# Patient Record
Sex: Male | Born: 2000 | Race: White | Hispanic: No | Marital: Single | Smoking: Never smoker
Health system: Southern US, Community
[De-identification: ages and names within clinical notes are randomized; demographics above are authoritative.]

## PROBLEM LIST (undated history)

## (undated) DIAGNOSIS — F902 Attention-deficit hyperactivity disorder, combined type: Principal | ICD-10-CM

## (undated) DIAGNOSIS — T7840XA Allergy, unspecified, initial encounter: Secondary | ICD-10-CM

## (undated) DIAGNOSIS — R278 Other lack of coordination: Secondary | ICD-10-CM

## (undated) DIAGNOSIS — H9325 Central auditory processing disorder: Secondary | ICD-10-CM

## (undated) HISTORY — DX: Central auditory processing disorder: H93.25

## (undated) HISTORY — DX: Other lack of coordination: R27.8

## (undated) HISTORY — DX: Attention-deficit hyperactivity disorder, combined type: F90.2

## (undated) HISTORY — DX: Allergy, unspecified, initial encounter: T78.40XA

---
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
## 2022-01-05 ENCOUNTER — Other Ambulatory Visit: Payer: Self-pay | Admitting: Pediatrics

## 2022-01-05 MED ORDER — LISDEXAMFETAMINE DIMESYLATE 70 MG PO CAPS: 70.0000 mg | ORAL_CAPSULE | ORAL | 0 refills | Status: DC

## 2022-01-05 NOTE — Telephone Encounter (Signed)
Requested return to [place] for above e-scribed and sent to pharmacy on record  [id number] - [place], [place]. AT [address] [place] [address] Phone: [phone] Fax: [phone]

## 2022-01-12 ENCOUNTER — Telehealth: Payer: Self-pay | Admitting: Pediatrics

## 2022-01-12 ENCOUNTER — Encounter: Payer: 59 | Admitting: Pediatrics

## 2022-01-12 NOTE — Telephone Encounter (Signed)
Called patient and mom regarding no-show.  Left messages on both phones and still no reply as to why they did not show up for the appointment, or to reschedule.

## 2022-02-02 ENCOUNTER — Other Ambulatory Visit: Payer: Self-pay | Admitting: Pediatrics

## 2022-02-02 MED ORDER — LISDEXAMFETAMINE DIMESYLATE 70 MG PO CAPS: 70.0000 mg | ORAL_CAPSULE | ORAL | 0 refills | Status: DC

## 2022-02-02 NOTE — Addendum Note (Signed)
Addended by: Cindia Hustead A on: 02/02/2022 12:13 PM   Modules accepted: Orders

## 2022-02-02 NOTE — Telephone Encounter (Signed)
RX for above e-scribed and sent to pharmacy on record  [id number] - [place], [place] - [address] [place] [address] Phone: [phone] Fax: [phone]    

## 2022-03-05 ENCOUNTER — Other Ambulatory Visit: Payer: Self-pay

## 2022-03-05 MED ORDER — LISDEXAMFETAMINE DIMESYLATE 70 MG PO CAPS: 70.0000 mg | ORAL_CAPSULE | ORAL | 0 refills | Status: DC

## 2022-03-05 NOTE — Telephone Encounter (Signed)
RX for above e-scribed and sent to pharmacy on record  [id number] - [place], [place] - [address] [place] [address] Phone: [phone] Fax: [phone]    

## 2022-04-02 ENCOUNTER — Other Ambulatory Visit: Payer: Self-pay

## 2022-04-02 MED ORDER — FLUOXETINE HCL 20 MG PO CAPS
20.0000 mg | ORAL_CAPSULE | Freq: Every day | ORAL | 0 refills | Status: DC
Start: 2022-04-02 — End: 2022-05-01

## 2022-04-02 MED ORDER — GUANFACINE HCL ER 3 MG PO TB24: 1.0000 | ORAL_TABLET | Freq: Two times a day (BID) | ORAL | 0 refills | Status: DC

## 2022-04-02 NOTE — Telephone Encounter (Signed)
Not seen since 06/2021 NOS to last appointment with no answers to phone call to reschedule.  Filled Rx for 30 days supply only Needs to make appointment E-Prescribed directly to  [id number] - [place], [place] - [address] AT [address] [facility] [address] [place] [address] Phone: [phone] Fax: [phone]

## 2022-04-11 ENCOUNTER — Other Ambulatory Visit: Payer: Self-pay | Admitting: Pediatrics

## 2022-04-11 MED ORDER — LISDEXAMFETAMINE DIMESYLATE 70 MG PO CAPS: 70.0000 mg | ORAL_CAPSULE | ORAL | 0 refills | Status: DC

## 2022-04-11 NOTE — Telephone Encounter (Signed)
Vyvanse 70 mg daily, # 30 with no RF's.RX for above e-scribed and sent to pharmacy on record  [id number] - [place], [place] - [address] [place] [address] Phone: [phone] Fax: [phone]      

## 2022-04-11 NOTE — Telephone Encounter (Signed)
Refill for vyvanse sent to [facility] pharmacy

## 2022-04-19 ENCOUNTER — Telehealth: Payer: Self-pay

## 2022-04-19 NOTE — Telephone Encounter (Signed)
Outcome  Additional Information Required  Drug is covered by current benefit plan. No further PA activity needed.

## 2022-05-01 ENCOUNTER — Other Ambulatory Visit: Payer: Self-pay

## 2022-05-01 ENCOUNTER — Other Ambulatory Visit: Payer: Self-pay | Admitting: Family

## 2022-05-01 MED ORDER — FLUOXETINE HCL 20 MG PO CAPS: 20.0000 mg | ORAL_CAPSULE | Freq: Every day | ORAL | 2 refills | Status: DC

## 2022-05-01 MED ORDER — GUANFACINE HCL ER 3 MG PO TB24: 1.0000 | ORAL_TABLET | Freq: Two times a day (BID) | ORAL | 2 refills | Status: DC

## 2022-05-01 NOTE — Telephone Encounter (Signed)
Prozac 20 mg daily #30 with 2 RF's and Intuniv 3 mg BID, #60 with 2 RF's.RX for above e-scribed and sent to pharmacy on record  [id number] [place], [place]. AT [address] [place] [address] Phone: [phone] Fax: [phone]

## 2022-05-02 NOTE — Telephone Encounter (Signed)
RX for above e-scribed and sent to pharmacy on record  [id number] - [place], [place] - [address] [place] [address] Phone: [phone] Fax: [phone]    

## 2022-05-08 ENCOUNTER — Other Ambulatory Visit: Payer: Self-pay

## 2022-05-08 MED ORDER — LISDEXAMFETAMINE DIMESYLATE 70 MG PO CAPS: 70.0000 mg | ORAL_CAPSULE | ORAL | 0 refills | Status: DC

## 2022-05-08 NOTE — Telephone Encounter (Signed)
RX for above e-scribed and sent to pharmacy on record  [id number] - [place], [place] - [address] [place]  [address] Phone: [phone] Fax: [phone]    

## 2022-05-28 ENCOUNTER — Other Ambulatory Visit: Payer: Self-pay | Admitting: Pediatrics

## 2022-05-28 NOTE — Telephone Encounter (Signed)
Intuniv 3 mg BID, #180 with 2 RF's.RX for above e-scribed and sent to pharmacy on record  [id number] [place] [address] [place]Phone: [phone]: [phone]

## 2022-06-04 ENCOUNTER — Encounter (HOSPITAL_BASED_OUTPATIENT_CLINIC_OR_DEPARTMENT_OTHER): Payer: Self-pay | Admitting: Family Medicine

## 2022-06-04 ENCOUNTER — Ambulatory Visit (INDEPENDENT_AMBULATORY_CARE_PROVIDER_SITE_OTHER): Payer: 59 | Admitting: Family Medicine

## 2022-06-04 ENCOUNTER — Telehealth (HOSPITAL_BASED_OUTPATIENT_CLINIC_OR_DEPARTMENT_OTHER): Payer: Self-pay | Admitting: Family Medicine

## 2022-06-04 VITALS — BP 121/59 | HR 76 | Ht 72.08 in | Wt 155.8 lb

## 2022-06-04 DIAGNOSIS — F902 Attention-deficit hyperactivity disorder, combined type: Secondary | ICD-10-CM | POA: Diagnosis not present

## 2022-06-04 DIAGNOSIS — L700 Acne vulgaris: Secondary | ICD-10-CM

## 2022-06-04 MED ORDER — LISDEXAMFETAMINE DIMESYLATE 70 MG PO CAPS: 70.0000 mg | ORAL_CAPSULE | ORAL | 0 refills | Status: DC

## 2022-06-04 NOTE — Assessment & Plan Note (Addendum)
Prescribed adapalene 0.1% per at bedtime PCP, reports "I have not been using that "reports when he has been applying the cream on his face he then lies down on something and it comes off.  Discussed changing the habit to keep the cream on his face patient reports he does not like change.  He wonders if it is what he is eating, information provided today on acne.  Discussed dermatology referral for further evaluation/treatment.  Patient declines today.  Encourage patient to follow treatment plan per PCP to see if this will help his symptoms. No change in treatment plan made today. Continue current treatment.

## 2022-06-04 NOTE — Telephone Encounter (Signed)
Pt Advised No to the FLU Vaccine

## 2022-06-04 NOTE — Progress Notes (Signed)
Established Patient Office Visit  Subjective   Patient ID: Joshua Ashley, male    DOB: 2000-07-10  Age: 22 y.o. MRN: [id number]  Chief Complaint  Patient presents with   Follow-up    Pt here for f/u on ADHD medication, pt also stated he would like to see if he could get acne medication prescribed in a pill form     HPI  ADHD: Joshua Ashley, previous prescriber recently retired. Symptoms are controlled well. Has been on this medication for "a couple of months" with good control of symptoms. Wants to stay on this medication. Has not been getting counseling. Works at [employer], symptoms managed well at work. Has been treated for ADHD for 12-13 years.  On Lisdexamfetamine 70 mg QD. PDMP review: # 30 given on 05/08/22.Wishes for primary care to take over prescribing. Meds due 06/12/22.    Acne: prescribed adapalene 0.1 %  cream for bedtime. "I have not been using that"  Reports putting cream on face and then putting his face onto something and it wipes off. Does not like changing habits.  Wonders if it is related to what he eats.  Declines derm referral today.  Bruise on right thumb from trampoline park. ROM intact. Ecchymosis present. No bony tenderness.   ENT referral recently with hearing test. Endorses using ear buds on regular basis. Encouraged not using ear buds.   Review of Systems  HENT:  Negative for ear pain and hearing loss.   Respiratory:  Negative for shortness of breath.   Cardiovascular:  Negative for chest pain.  Neurological:  Negative for dizziness and headaches.      Objective:     BP (!) 121/59 (BP Location: Right Arm, Patient Position: Sitting, Cuff Size: Large)   Pulse 76   Ht 6' 0.08" (1.831 m)   Wt 155 lb 12.8 oz (70.7 kg)   SpO2 99%   BMI 21.08 kg/m  BP Readings from Last 3 Encounters:  06/04/22 (!) 121/59  09/27/21 111/65  09/23/21 (!) 112/55      Physical Exam Vitals and nursing note reviewed.  Constitutional:      General: He is not in  acute distress.    Appearance: Normal appearance. He is normal weight.  HENT:     Right Ear: Tympanic membrane normal.     Left Ear: Tympanic membrane normal.  Pulmonary:     Effort: Pulmonary effort is normal.  Musculoskeletal:     Right hand: No swelling, tenderness or bony tenderness. Normal range of motion. Normal strength. Normal pulse.     Comments: Ecchymosis noted under right thumb. No bony tenderness fingers/wrist. ROM intact to fingers and wrist.   Skin:    General: Skin is warm and dry.     Capillary Refill: Capillary refill takes less than 2 seconds.  Neurological:     General: No focal deficit present.     Mental Status: He is alert. Mental status is at baseline.  Psychiatric:        Mood and Affect: Mood normal.        Behavior: Behavior normal.        Thought Content: Thought content normal.        Judgment: Judgment normal.     No results found for any visits on 06/04/22.    The ASCVD Risk score (Arnett DK, et al., 2019) failed to calculate for the following reasons:   The 2019 ASCVD risk score is only valid for ages 57 to  79    Assessment & Plan:   Problem List Items Addressed This Visit     ADHD (attention deficit hyperactivity disorder), combined type - Primary (Chronic)    Taking Vyvanse 70 mg daily as prescribed.  Previous prescriber has retired, patient would like to transfer medication management to PCP.  Reports symptoms are well-controlled, he has been on this medication for "a couple of months "with good symptom control.  Wants to stay on this medication at the current dose.  Patient works at [employer] and feels his symptoms are well-managed at work as well.  He has been treated for ADHD for 12 to 13 years.  PDMP does not reveal red flags, refill will be sent today this medication is due on 06/12/2022.      Relevant Medications   lisdexamfetamine (VYVANSE) 70 MG capsule (Start on 06/12/2022)   Acne    Prescribed adapalene 0.1% per at bedtime PCP,  reports "I have not been using that "reports when he has been applying the cream on his face he then lies down on something and it comes off.  Discussed changing the habit to keep the cream on his face patient reports he does not like change.  He wonders if it is what he is eating, information provided today on acne.  Discussed dermatology referral for further evaluation/treatment.  Patient declines today.  Encourage patient to follow treatment plan per PCP to see if this will help his symptoms. No change in treatment plan made today. Continue current treatment.      Right thumb ecchymosis. Recommend ice. ROM intact. No bony tenderness. Time will heal the bruise. No indication for x-ray today.   Ear bud use. Recommend decreasing this. Some cerumen present, not impacted. Reports normal hearing test recently. Declines ENT referral today. Okay to use OTC Debrox for cerumen removal.   Agrees with plan of care discussed.  Questions answered.  Return in about 3 months (around 09/02/2022) for ADHD .    Chalmers Guest, FNP

## 2022-06-04 NOTE — Assessment & Plan Note (Signed)
Taking Vyvanse 70 mg daily as prescribed.  Previous prescriber has retired, patient would like to transfer medication management to PCP.  Reports symptoms are well-controlled, he has been on this medication for "a couple of months "with good symptom control.  Wants to stay on this medication at the current dose.  Patient works at [employer] and feels his symptoms are well-managed at work as well.  He has been treated for ADHD for 12 to 13 years.  PDMP does not reveal red flags, refill will be sent today this medication is due on 06/12/2022.

## 2022-06-04 NOTE — Patient Instructions (Signed)
Try new technique for putting cream on face.

## 2022-06-06 ENCOUNTER — Encounter: Payer: 59 | Admitting: Pediatrics

## 2022-06-13 ENCOUNTER — Telehealth (HOSPITAL_BASED_OUTPATIENT_CLINIC_OR_DEPARTMENT_OTHER): Payer: Self-pay | Admitting: Family Medicine

## 2022-06-13 NOTE — Telephone Encounter (Signed)
Pts father called needing medication  lisdexamfetamine (VYVANSE) 56 MG capsule [id number]  Sent to  [facility] [address] [place], [place], [place] [address]  [facility]. Please advise.

## 2022-06-14 ENCOUNTER — Other Ambulatory Visit: Payer: Self-pay | Admitting: Family Medicine

## 2022-06-14 DIAGNOSIS — F902 Attention-deficit hyperactivity disorder, combined type: Secondary | ICD-10-CM

## 2022-06-14 MED ORDER — LISDEXAMFETAMINE DIMESYLATE 70 MG PO CAPS: 70.0000 mg | ORAL_CAPSULE | ORAL | 0 refills | Status: DC

## 2022-06-14 NOTE — Progress Notes (Signed)
Pharmacy initially sent does not have in stock. [facility] [address]

## 2022-06-14 NOTE — Progress Notes (Signed)
Reports the last pharmacy prescription sent was incorrect.  Sent to [facility] on [address] per father request. PDMP reviewed. Last filled 05/08/22.

## 2022-06-14 NOTE — Telephone Encounter (Signed)
Father called it was suppose to be [facility] on [address] [place], [place] [address]. Communication error.

## 2022-06-14 NOTE — Telephone Encounter (Signed)
Pt father just called stated that the pharmacy at [facility] does not take insurance. Would like it canceled [facility]   [facility] [address], [place], [place] [address] Please advise.

## 2022-07-12 ENCOUNTER — Ambulatory Visit (INDEPENDENT_AMBULATORY_CARE_PROVIDER_SITE_OTHER): Payer: 59 | Admitting: Student

## 2022-07-12 ENCOUNTER — Other Ambulatory Visit: Payer: Self-pay | Admitting: Family Medicine

## 2022-07-12 ENCOUNTER — Ambulatory Visit (INDEPENDENT_AMBULATORY_CARE_PROVIDER_SITE_OTHER): Payer: 59

## 2022-07-12 ENCOUNTER — Other Ambulatory Visit (HOSPITAL_BASED_OUTPATIENT_CLINIC_OR_DEPARTMENT_OTHER): Payer: Self-pay | Admitting: Family Medicine

## 2022-07-12 ENCOUNTER — Other Ambulatory Visit (HOSPITAL_BASED_OUTPATIENT_CLINIC_OR_DEPARTMENT_OTHER): Payer: Self-pay

## 2022-07-12 DIAGNOSIS — F902 Attention-deficit hyperactivity disorder, combined type: Secondary | ICD-10-CM

## 2022-07-12 DIAGNOSIS — M79644 Pain in right finger(s): Secondary | ICD-10-CM

## 2022-07-12 DIAGNOSIS — S63641A Sprain of metacarpophalangeal joint of right thumb, initial encounter: Secondary | ICD-10-CM | POA: Diagnosis not present

## 2022-07-12 MED ORDER — LISDEXAMFETAMINE DIMESYLATE 70 MG PO CAPS: 70.0000 mg | ORAL_CAPSULE | ORAL | 0 refills | Status: DC

## 2022-07-12 NOTE — Progress Notes (Signed)
Chief Complaint: Right thumb pain     History of Present Illness:    Joshua Ashley is a 22 y.o. male presents for evaluation of pain in his right thumb.  He states that on Saturday he was at a trampoline park when he hyperextended his thumb back toward his wrist and felt a popping sensation.  He has since then had some aching pain in the base of his thumb and problems specifically when trying to lift heavier objects.  He has no previous history of injury to this thumb.  He is right-hand dominant.  His pain levels are very minimal today however it is more uncomfortable when performing certain activities.  He has been taking ibuprofen as well as icing which has helped. He works at [employer] nearby and worked a shift this morning.   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 Social History   Socioeconomic History   Marital status: Single    Spouse name: Not on file   Number of children: Not on file   Years of education: Not on file   Highest education level: Not on file  Occupational History   Not on file  Tobacco Use   Smoking status: Never    Passive exposure: Never   Smokeless tobacco: Never  Substance and Sexual Activity   Alcohol use: Never   Drug use: Never   Sexual activity: Never  Other Topics Concern   Not on file  Social History Narrative   Not on file   Social Determinants of Health   Financial Resource Strain: Not on file  Food Insecurity: Not on file  Transportation Needs: Not on file  Physical Activity: Not on file  Stress: Not on file  Social Connections: Not on file   No family history on file. Allergies  Allergen Reactions   Carrot Flavor  [Flavoring Agent] Hives   Pear Hives   Current Outpatient Medications  Medication Sig Dispense Refill   adapalene (DIFFERIN) 0.1 % cream Apply topically at bedtime. (Patient not taking: Reported on 06/04/2022) 45 g 1   Benzoyl Peroxide 2.5 % CREA Apply 1 application. topically daily. 21 g 2   FLUoxetine (PROZAC) 20 MG capsule Take 1 capsule (20 mg total) by mouth daily. 30 capsule 2   GuanFACINE HCl 3 MG TB24 TAKE 1 TABLET (3 MG TOTAL) BY MOUTH TWICE A DAY 180 tablet 2   lisdexamfetamine (VYVANSE) 70 MG capsule Take 1 capsule (70 mg total) by mouth every morning. 30 capsule 0   loratadine (CLARITIN) 10 MG tablet Take 10 mg by mouth daily.     omeprazole (PRILOSEC) 20 MG capsule Take by mouth daily.  6   No current facility-administered medications for this visit.   No results found.  Review of Systems:   A ROS was performed including pertinent positives and negatives as documented in the HPI.  Physical Exam :   Constitutional: NAD and appears stated age Neurological: Alert and oriented Psych: Appropriate affect and cooperative There were no vitals taken for this visit.   Comprehensive Musculoskeletal Exam:    Patient  has mild tenderness to palpation over the right first metatarsal and MCP joint.  Mild soft tissue edema around base of the thumb. No tenderness throughout [facility] joint, wrist, or distal forearm.  Normal active and passive range of motion throughout thumb IP and MCP joint with 5/5 strength.  Wrist range of motion to 90 degrees flexion and 20 degrees extension without pain.  Radial pulse 2+.  Distal sensation intact and equal throughout all 5 digits.  Imaging:   Xray (right hand 3 views): Negative for fracture or dislocation  I personally reviewed and interpreted the radiographs.   Assessment:   22 y.o. male presenting for assessment of right thumb pain he sustained in a hyperextension injury on Saturday.  X-rays are negative for any acute fracture or dislocation.  I  suspect this to be a sprain and that he would benefit from some immobilization to allow healing.  I recommended that he use a thumb splint for the next few weeks, especially while performing physical activities or at work. He can begin weaning out as tolerated at that point. Also advised that he can continue with ice and ibuprofen as needed for pain and swelling control. Patient is in agreement and all questions addressed and answered.  Plan :    - Use thumb splint and follow up in clinic as needed     I personally saw and evaluated the patient, and participated in the management and treatment plan.  Marnee Spring, PA-C Orthopedics  This document was dictated using Systems analyst. A reasonable attempt at proof reading has been made to minimize errors.

## 2022-07-16 ENCOUNTER — Other Ambulatory Visit (HOSPITAL_BASED_OUTPATIENT_CLINIC_OR_DEPARTMENT_OTHER): Payer: Self-pay | Admitting: Family Medicine

## 2022-07-16 DIAGNOSIS — F902 Attention-deficit hyperactivity disorder, combined type: Secondary | ICD-10-CM

## 2022-07-16 MED ORDER — LISDEXAMFETAMINE DIMESYLATE 70 MG PO CAPS: 70.0000 mg | ORAL_CAPSULE | ORAL | 0 refills | Status: DC

## 2022-07-16 NOTE — Progress Notes (Signed)
Printed controlled prescription for Dr. Tennis Must Cuba's patient since it was not able to be sent electronically last week on 07/12/22. Reviewed PDMP, no red flags. Signed paper prescription and was faxed by Dorothyann Gibbs to his requested pharmacy.

## 2022-08-08 ENCOUNTER — Telehealth (HOSPITAL_BASED_OUTPATIENT_CLINIC_OR_DEPARTMENT_OTHER): Payer: Self-pay | Admitting: Family Medicine

## 2022-08-08 ENCOUNTER — Encounter (HOSPITAL_BASED_OUTPATIENT_CLINIC_OR_DEPARTMENT_OTHER): Payer: Self-pay | Admitting: Family Medicine

## 2022-08-08 ENCOUNTER — Other Ambulatory Visit (HOSPITAL_BASED_OUTPATIENT_CLINIC_OR_DEPARTMENT_OTHER): Payer: Self-pay

## 2022-08-08 DIAGNOSIS — F902 Attention-deficit hyperactivity disorder, combined type: Secondary | ICD-10-CM

## 2022-08-08 MED ORDER — LISDEXAMFETAMINE DIMESYLATE 70 MG PO CAPS: 70.0000 mg | ORAL_CAPSULE | ORAL | 0 refills | Status: DC

## 2022-08-08 NOTE — Telephone Encounter (Signed)
Refill request sent to provider.

## 2022-08-08 NOTE — Telephone Encounter (Signed)
Can I get a script for MRN [id number]

## 2022-09-03 ENCOUNTER — Ambulatory Visit (HOSPITAL_BASED_OUTPATIENT_CLINIC_OR_DEPARTMENT_OTHER): Payer: 59 | Admitting: Family Medicine

## 2022-09-12 ENCOUNTER — Other Ambulatory Visit (HOSPITAL_BASED_OUTPATIENT_CLINIC_OR_DEPARTMENT_OTHER): Payer: Self-pay

## 2022-09-13 ENCOUNTER — Ambulatory Visit (INDEPENDENT_AMBULATORY_CARE_PROVIDER_SITE_OTHER): Payer: 59 | Admitting: Family Medicine

## 2022-09-13 ENCOUNTER — Other Ambulatory Visit (HOSPITAL_BASED_OUTPATIENT_CLINIC_OR_DEPARTMENT_OTHER): Payer: Self-pay

## 2022-09-13 ENCOUNTER — Encounter (HOSPITAL_BASED_OUTPATIENT_CLINIC_OR_DEPARTMENT_OTHER): Payer: Self-pay | Admitting: Family Medicine

## 2022-09-13 VITALS — BP 108/69 | HR 69 | Ht 72.11 in | Wt 155.0 lb

## 2022-09-13 DIAGNOSIS — F902 Attention-deficit hyperactivity disorder, combined type: Secondary | ICD-10-CM

## 2022-09-13 DIAGNOSIS — F419 Anxiety disorder, unspecified: Secondary | ICD-10-CM | POA: Insufficient documentation

## 2022-09-13 DIAGNOSIS — L259 Unspecified contact dermatitis, unspecified cause: Secondary | ICD-10-CM

## 2022-09-13 DIAGNOSIS — M79674 Pain in right toe(s): Secondary | ICD-10-CM

## 2022-09-13 DIAGNOSIS — F17290 Nicotine dependence, other tobacco product, uncomplicated: Secondary | ICD-10-CM | POA: Insufficient documentation

## 2022-09-13 MED ORDER — FLUOXETINE HCL 20 MG PO CAPS
20.0000 mg | ORAL_CAPSULE | Freq: Every day | ORAL | 1 refills | Status: DC
  Filled 2022-09-13: qty 30, 30d supply, fill #0
  Filled 2022-10-09: qty 30, 30d supply, fill #1
  Filled 2022-11-24: qty 30, 30d supply, fill #2
  Filled 2022-12-16: qty 30, 30d supply, fill #3
  Filled 2023-01-28: qty 30, 30d supply, fill #4
  Filled 2023-02-17 – 2023-03-15 (×2): qty 30, 30d supply, fill #5

## 2022-09-13 MED ORDER — TRIAMCINOLONE ACETONIDE 0.1 % EX CREA
1.0000 | TOPICAL_CREAM | Freq: Two times a day (BID) | CUTANEOUS | 0 refills | Status: DC
  Filled 2022-09-13: qty 30, 15d supply, fill #0

## 2022-09-13 MED ORDER — LISDEXAMFETAMINE DIMESYLATE 70 MG PO CAPS
70.0000 mg | ORAL_CAPSULE | ORAL | 0 refills | Status: DC
  Filled 2022-09-13: qty 30, 30d supply, fill #0

## 2022-09-13 NOTE — Assessment & Plan Note (Signed)
Patient doing well at present with use of Vyvanse and guanfacine.  He continues with other medications.  He is requesting refill of Vyvanse today.  Denies any issues with sleep concerns, palpitations, appetite concerns.  PDMP reviewed, no red flags.  Refill of Vyvanse provided today.

## 2022-09-13 NOTE — Assessment & Plan Note (Signed)
Well-controlled with current regimen, requesting refill of fluoxetine today.  Denies any issues with medication. Refill of fluoxetine provided today

## 2022-09-13 NOTE — Progress Notes (Signed)
    Procedures performed today:    None.  Independent interpretation of notes and tests performed by another provider:   None.  Brief History, Exam, Impression, and Recommendations:    BP 108/69 (BP Location: Right Arm, Patient Position: Sitting, Cuff Size: Normal)   Pulse 69   Ht 6' 0.11" (1.832 m)   Wt 155 lb (70.3 kg)   SpO2 100%   BMI 20.96 kg/m   Contact dermatitis Over right chest, patient notes intermittent redness/bumps/skin changes.  He feels that this is related to his name tag for work.  He will wear magnet on the underside of short to hold main tag in place.  It is in this area that he has reported rash.  Has occasionally had itching, intermittent flareups in this area. On exam, patient does have sandpapery feeling skin, intermittent red bumps, no obvious excoriations, no discharge or drainage. Suspect contact dermatitis, discussed options with patient.  Will allow for utilization of steroid cream to help with controlling any itch.  Advised on using barrier between skin and magnet for name tag.  Discussed options such as undershirt, tape or some other barrier applied to back of magnet  ADHD (attention deficit hyperactivity disorder), combined type Patient doing well at present with use of Vyvanse and guanfacine.  He continues with other medications.  He is requesting refill of Vyvanse today.  Denies any issues with sleep concerns, palpitations, appetite concerns.  PDMP reviewed, no red flags.  Refill of Vyvanse provided today.  Great toe pain, right Patient reports pain at MCP joint of great toe which has been present for about 1 year.  Does not recall specific trauma, thinks he may have jammed the toe jumping on trampoline at [facility] in the past and that this may be related.  Pain mostly noticed when plantar flexing great toe.  Has not had any swelling.  No prior evaluation. On exam, no significant tenderness to palpation at MCP joint, normal active and passive range of  motion, mild discomfort with plantarflexion at MCP joint, no significant pain with dorsiflexion. Uncertain etiology, possible capsulitis.  Discussed considerations for evaluation, treatment, management.  At this time, we will proceed with referral to podiatry, could consider use of inserts, possible inserts to limit degree of flexion at MCP joint  Anxiety Well-controlled with current regimen, requesting refill of fluoxetine today.  Denies any issues with medication. Refill of fluoxetine provided today  Vaping nicotine dependence, tobacco product Reports concerns with ongoing vaping use, also indicates that his mother is concerned and would like patient to consider methods to assist with cessation.  Patient does not feel that he is necessarily addicted to nicotine but does find that he has difficulty with the habit he has formed with utilizing vape. We discussed options today which primarily centered around decreasing nicotine dependence.  We discussed gum, lozenges, patches.  We also discussed potential utilization of Chantix.  Patient plans to consider these options and plan to further discuss these at future visit  Return in about 3 months (around 12/14/2022).   ___________________________________________ Lucill Mauck de [place], MD, ABFM, CAQSM Primary Care and Sports Medicine [facility]

## 2022-09-13 NOTE — Assessment & Plan Note (Signed)
Reports concerns with ongoing vaping use, also indicates that his mother is concerned and would like patient to consider methods to assist with cessation.  Patient does not feel that he is necessarily addicted to nicotine but does find that he has difficulty with the habit he has formed with utilizing vape. We discussed options today which primarily centered around decreasing nicotine dependence.  We discussed gum, lozenges, patches.  We also discussed potential utilization of Chantix.  Patient plans to consider these options and plan to further discuss these at future visit

## 2022-09-13 NOTE — Assessment & Plan Note (Signed)
Over right chest, patient notes intermittent redness/bumps/skin changes.  He feels that this is related to his name tag for work.  He will wear magnet on the underside of short to hold main tag in place.  It is in this area that he has reported rash.  Has occasionally had itching, intermittent flareups in this area. On exam, patient does have sandpapery feeling skin, intermittent red bumps, no obvious excoriations, no discharge or drainage. Suspect contact dermatitis, discussed options with patient.  Will allow for utilization of steroid cream to help with controlling any itch.  Advised on using barrier between skin and magnet for name tag.  Discussed options such as undershirt, tape or some other barrier applied to back of magnet

## 2022-09-13 NOTE — Assessment & Plan Note (Signed)
Patient reports pain at MCP joint of great toe which has been present for about 1 year.  Does not recall specific trauma, thinks Joshua Ashley may have jammed the toe jumping on trampoline at [facility] in the past and that this may be related.  Pain mostly noticed when plantar flexing great toe.  Has not had any swelling.  No prior evaluation. On exam, no significant tenderness to palpation at MCP joint, normal active and passive range of motion, mild discomfort with plantarflexion at MCP joint, no significant pain with dorsiflexion. Uncertain etiology, possible capsulitis.  Discussed considerations for evaluation, treatment, management.  At this time, we will proceed with referral to podiatry, could consider use of inserts, possible inserts to limit degree of flexion at MCP joint

## 2022-09-14 ENCOUNTER — Encounter (HOSPITAL_BASED_OUTPATIENT_CLINIC_OR_DEPARTMENT_OTHER): Payer: Self-pay | Admitting: Family Medicine

## 2022-09-15 ENCOUNTER — Other Ambulatory Visit (HOSPITAL_BASED_OUTPATIENT_CLINIC_OR_DEPARTMENT_OTHER): Payer: Self-pay

## 2022-09-20 ENCOUNTER — Telehealth: Payer: Self-pay

## 2022-09-20 ENCOUNTER — Other Ambulatory Visit (HOSPITAL_BASED_OUTPATIENT_CLINIC_OR_DEPARTMENT_OTHER): Payer: Self-pay

## 2022-09-20 MED ORDER — VARENICLINE TARTRATE (STARTER) 0.5 MG X 11 & 1 MG X 42 PO TBPK
ORAL_TABLET | ORAL | 0 refills | Status: AC
Start: 2022-09-20 — End: 2022-10-24
  Filled 2022-09-20: qty 53, 31d supply, fill #0

## 2022-09-20 NOTE — Telephone Encounter (Signed)
LVM for patient to call back [phone], or to call PCP office to schedule follow up apt. AS, CMA  

## 2022-09-20 NOTE — Addendum Note (Signed)
Addended by: DE Peru, Marcy Salvo J on: 09/20/2022 02:18 PM   Modules accepted: Orders

## 2022-10-09 ENCOUNTER — Other Ambulatory Visit: Payer: Self-pay

## 2022-10-09 ENCOUNTER — Other Ambulatory Visit (HOSPITAL_BASED_OUTPATIENT_CLINIC_OR_DEPARTMENT_OTHER): Payer: Self-pay | Admitting: Family Medicine

## 2022-10-09 DIAGNOSIS — F902 Attention-deficit hyperactivity disorder, combined type: Secondary | ICD-10-CM

## 2022-10-10 ENCOUNTER — Other Ambulatory Visit (HOSPITAL_BASED_OUTPATIENT_CLINIC_OR_DEPARTMENT_OTHER): Payer: Self-pay | Admitting: Family Medicine

## 2022-10-10 ENCOUNTER — Other Ambulatory Visit (HOSPITAL_BASED_OUTPATIENT_CLINIC_OR_DEPARTMENT_OTHER): Payer: Self-pay

## 2022-10-10 DIAGNOSIS — F902 Attention-deficit hyperactivity disorder, combined type: Secondary | ICD-10-CM

## 2022-10-10 MED ORDER — LISDEXAMFETAMINE DIMESYLATE 70 MG PO CAPS
70.0000 mg | ORAL_CAPSULE | ORAL | 0 refills | Status: DC
Start: 2022-10-12 — End: 2022-11-14
  Filled 2022-10-12: qty 30, 30d supply, fill #0

## 2022-10-10 NOTE — Progress Notes (Signed)
Printed prescription for refill of Vyvanse requested in office today.  Advised on providing paper prescription and protocols related to this.  PDMP reviewed, no red flags.  Provided paper prescriptions today.  Advised on earliest fill date pertaining to this.

## 2022-10-12 ENCOUNTER — Other Ambulatory Visit (HOSPITAL_BASED_OUTPATIENT_CLINIC_OR_DEPARTMENT_OTHER): Payer: Self-pay

## 2022-11-14 ENCOUNTER — Other Ambulatory Visit (HOSPITAL_BASED_OUTPATIENT_CLINIC_OR_DEPARTMENT_OTHER): Payer: Self-pay

## 2022-11-14 ENCOUNTER — Other Ambulatory Visit (HOSPITAL_BASED_OUTPATIENT_CLINIC_OR_DEPARTMENT_OTHER): Payer: Self-pay | Admitting: Family Medicine

## 2022-11-14 DIAGNOSIS — F902 Attention-deficit hyperactivity disorder, combined type: Secondary | ICD-10-CM

## 2022-11-17 ENCOUNTER — Other Ambulatory Visit (HOSPITAL_BASED_OUTPATIENT_CLINIC_OR_DEPARTMENT_OTHER): Payer: Self-pay

## 2022-11-19 ENCOUNTER — Telehealth (HOSPITAL_BASED_OUTPATIENT_CLINIC_OR_DEPARTMENT_OTHER): Payer: Self-pay | Admitting: Family Medicine

## 2022-11-19 ENCOUNTER — Other Ambulatory Visit (HOSPITAL_BASED_OUTPATIENT_CLINIC_OR_DEPARTMENT_OTHER): Payer: Self-pay

## 2022-11-19 MED ORDER — LISDEXAMFETAMINE DIMESYLATE 70 MG PO CAPS
70.0000 mg | ORAL_CAPSULE | ORAL | 0 refills | Status: DC
Start: 2022-11-19 — End: 2022-12-16
  Filled 2022-11-19: qty 30, 30d supply, fill #0

## 2022-11-19 NOTE — Telephone Encounter (Signed)
Patients father called in very upset on medication VIVANSE 70mg , having trouble getting refills. Pharmacy has been notified and sent to our office to get refilled. Patient has been asked to take a break at work until he gets his meds.  Please call dad and let him know prescription has been sent in or why he has trouble every month getting his prescriptions. [phone] Kathreen Cosier

## 2022-11-19 NOTE — Telephone Encounter (Signed)
Patient dad was called back in regards to this matter. I had to explain to dad controlled substance medications cannot be filled early or picked up. Just wanted to let him know for future preference.

## 2022-11-26 ENCOUNTER — Other Ambulatory Visit (HOSPITAL_BASED_OUTPATIENT_CLINIC_OR_DEPARTMENT_OTHER): Payer: Self-pay

## 2022-11-26 MED FILL — Guanfacine HCl Tab ER 24HR 3 MG (Base Equiv): ORAL | 30 days supply | Qty: 60 | Fill #0 | Status: AC

## 2022-11-27 ENCOUNTER — Other Ambulatory Visit (HOSPITAL_BASED_OUTPATIENT_CLINIC_OR_DEPARTMENT_OTHER): Payer: Self-pay

## 2022-11-27 ENCOUNTER — Other Ambulatory Visit (HOSPITAL_COMMUNITY): Payer: Self-pay

## 2022-11-27 ENCOUNTER — Telehealth (HOSPITAL_BASED_OUTPATIENT_CLINIC_OR_DEPARTMENT_OTHER): Payer: Self-pay | Admitting: Family Medicine

## 2022-11-27 NOTE — Telephone Encounter (Signed)
Pt stopped by as the pharmacy needed a RX for --GuanFACINE HCl 3 MG TB24 once a day   Per the script he is suppose to take them 2 times daily which is causing the need for Authorization  So it is $0 for 60 tablets (30 day supply based on current instructions). If he is only supposed to be taking it once a daily though, we should get a new rx stating that

## 2022-11-29 ENCOUNTER — Other Ambulatory Visit (HOSPITAL_BASED_OUTPATIENT_CLINIC_OR_DEPARTMENT_OTHER): Payer: Self-pay

## 2022-11-29 MED ORDER — GUANFACINE HCL ER 3 MG PO TB24
3.0000 mg | ORAL_TABLET | Freq: Every day | ORAL | 1 refills | Status: DC
  Filled 2022-11-29 – 2022-12-18 (×2): qty 90, 90d supply, fill #0
  Filled 2022-12-19: qty 30, 30d supply, fill #0
  Filled 2023-02-17: qty 30, 30d supply, fill #1
  Filled 2023-03-24: qty 30, 30d supply, fill #2
  Filled 2023-05-01: qty 30, 30d supply, fill #3
  Filled 2023-05-26: qty 30, 30d supply, fill #4
  Filled 2023-06-25: qty 30, 30d supply, fill #5

## 2022-11-29 NOTE — Addendum Note (Signed)
Addended by: DE Peru, Marcy Salvo J on: 11/29/2022 01:01 PM   Modules accepted: Orders

## 2022-12-10 ENCOUNTER — Ambulatory Visit (HOSPITAL_BASED_OUTPATIENT_CLINIC_OR_DEPARTMENT_OTHER): Payer: 59 | Admitting: Family Medicine

## 2022-12-16 ENCOUNTER — Other Ambulatory Visit (HOSPITAL_BASED_OUTPATIENT_CLINIC_OR_DEPARTMENT_OTHER): Payer: Self-pay | Admitting: Family Medicine

## 2022-12-16 DIAGNOSIS — F902 Attention-deficit hyperactivity disorder, combined type: Secondary | ICD-10-CM

## 2022-12-17 ENCOUNTER — Other Ambulatory Visit (HOSPITAL_BASED_OUTPATIENT_CLINIC_OR_DEPARTMENT_OTHER): Payer: Self-pay

## 2022-12-18 ENCOUNTER — Other Ambulatory Visit: Payer: Self-pay

## 2022-12-18 NOTE — Telephone Encounter (Signed)
Patients father also calling in today wondering why son's medication has such a hard time filling his prescriptions for Vyvanse 70mg . The need to figure out a way that he can get his medication each month without a hassle and can we come up with a protocol to help them.

## 2022-12-19 ENCOUNTER — Other Ambulatory Visit: Payer: Self-pay

## 2022-12-19 ENCOUNTER — Other Ambulatory Visit (HOSPITAL_BASED_OUTPATIENT_CLINIC_OR_DEPARTMENT_OTHER): Payer: Self-pay

## 2022-12-19 ENCOUNTER — Telehealth (HOSPITAL_BASED_OUTPATIENT_CLINIC_OR_DEPARTMENT_OTHER): Payer: Self-pay

## 2022-12-19 MED ORDER — LISDEXAMFETAMINE DIMESYLATE 70 MG PO CAPS
70.0000 mg | ORAL_CAPSULE | ORAL | 0 refills | Status: DC
Start: 2022-12-19 — End: 2023-01-01
  Filled 2022-12-19: qty 13, 13d supply, fill #0

## 2022-12-19 NOTE — Telephone Encounter (Signed)
Call from patients dad, frustrated again due to delays in getting sons Vyvanse. Spoke with patients dad, explaining the controlled substance agreement & UDS policy that we have. I do not see that patient has a CSA in his chart. Father states he was told patient should follow up every 6 months for refills on this medication from his prior PCP that has now since closed, I clarified that it is every 3 months for controlled medications. He expressed his frustration, how there is a delay every month getting this medication, he states we need a better way on responding to refills. I recommended patient possibly send refill request PRIOR to running out of medication to prevent this monthly issue. Father wishes to speak directly to Dr.DeCuba regarding this issue, he plans on coming to a future appointment to discuss this with Dr.DeCuba but father is unable to make appointment with son on 09/17. I will see if a provider in office will allow for a short fill to last until upcoming OV, father states patient is at work, unable to work due to being without meds for 3 days.

## 2022-12-19 NOTE — Telephone Encounter (Signed)
Father calling back to follow up on prescription

## 2022-12-22 IMAGING — DX DG ANKLE COMPLETE 3+V*L*
3 series · 3 of 3 positions shown · non-contrast
Comparison: None Available.

CLINICAL DATA: Left ankle injury with pain and swelling.

EXAM:
LEFT ANKLE COMPLETE - 3+ VIEW

[ankle ap]
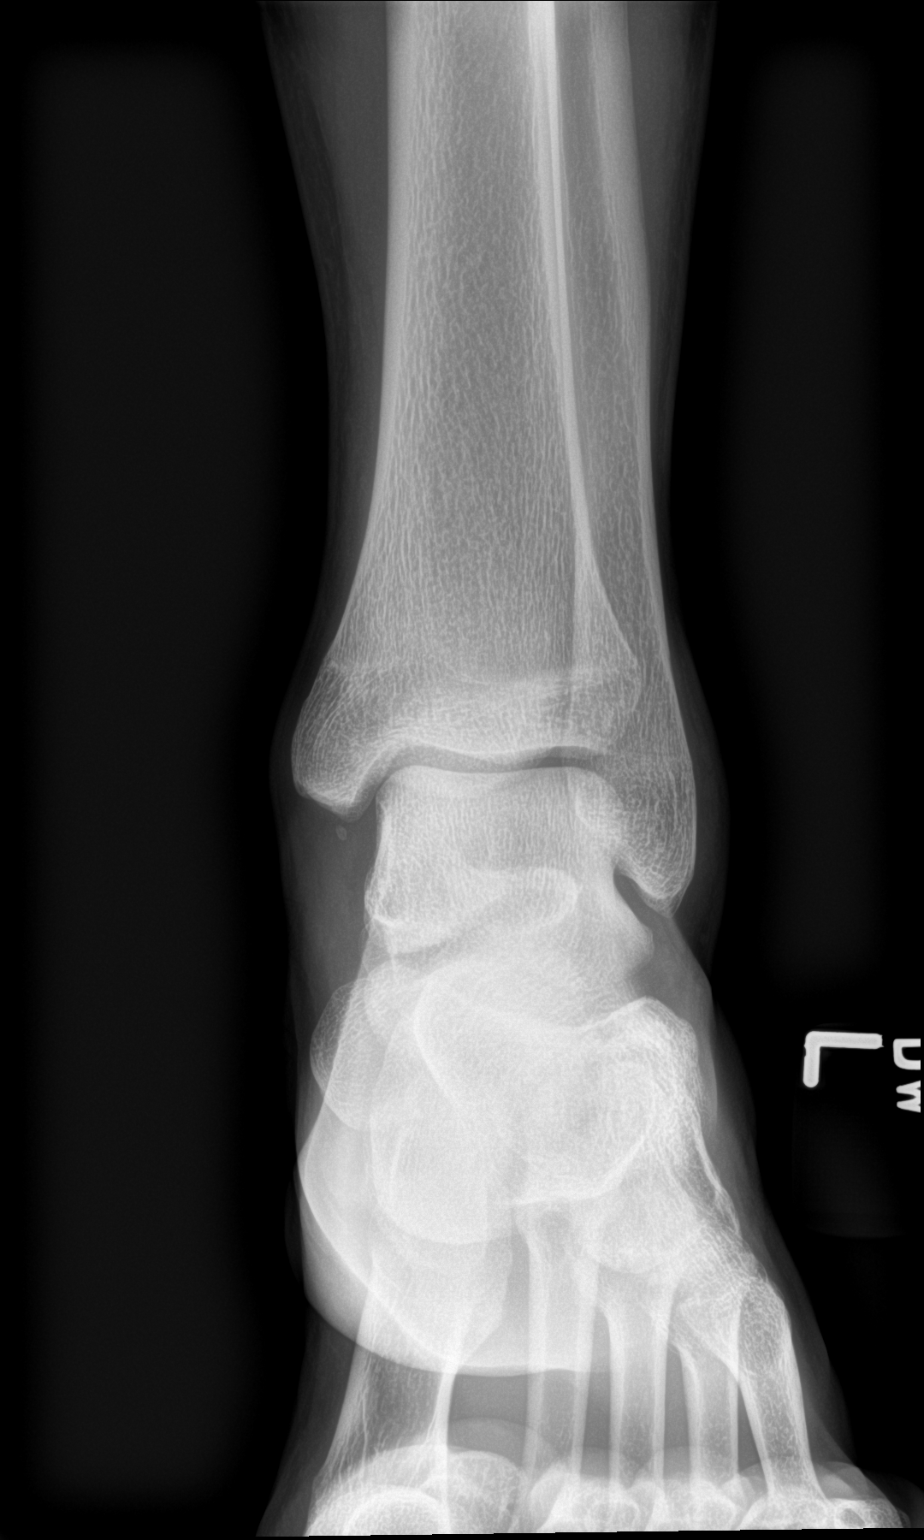

[ankle obl]
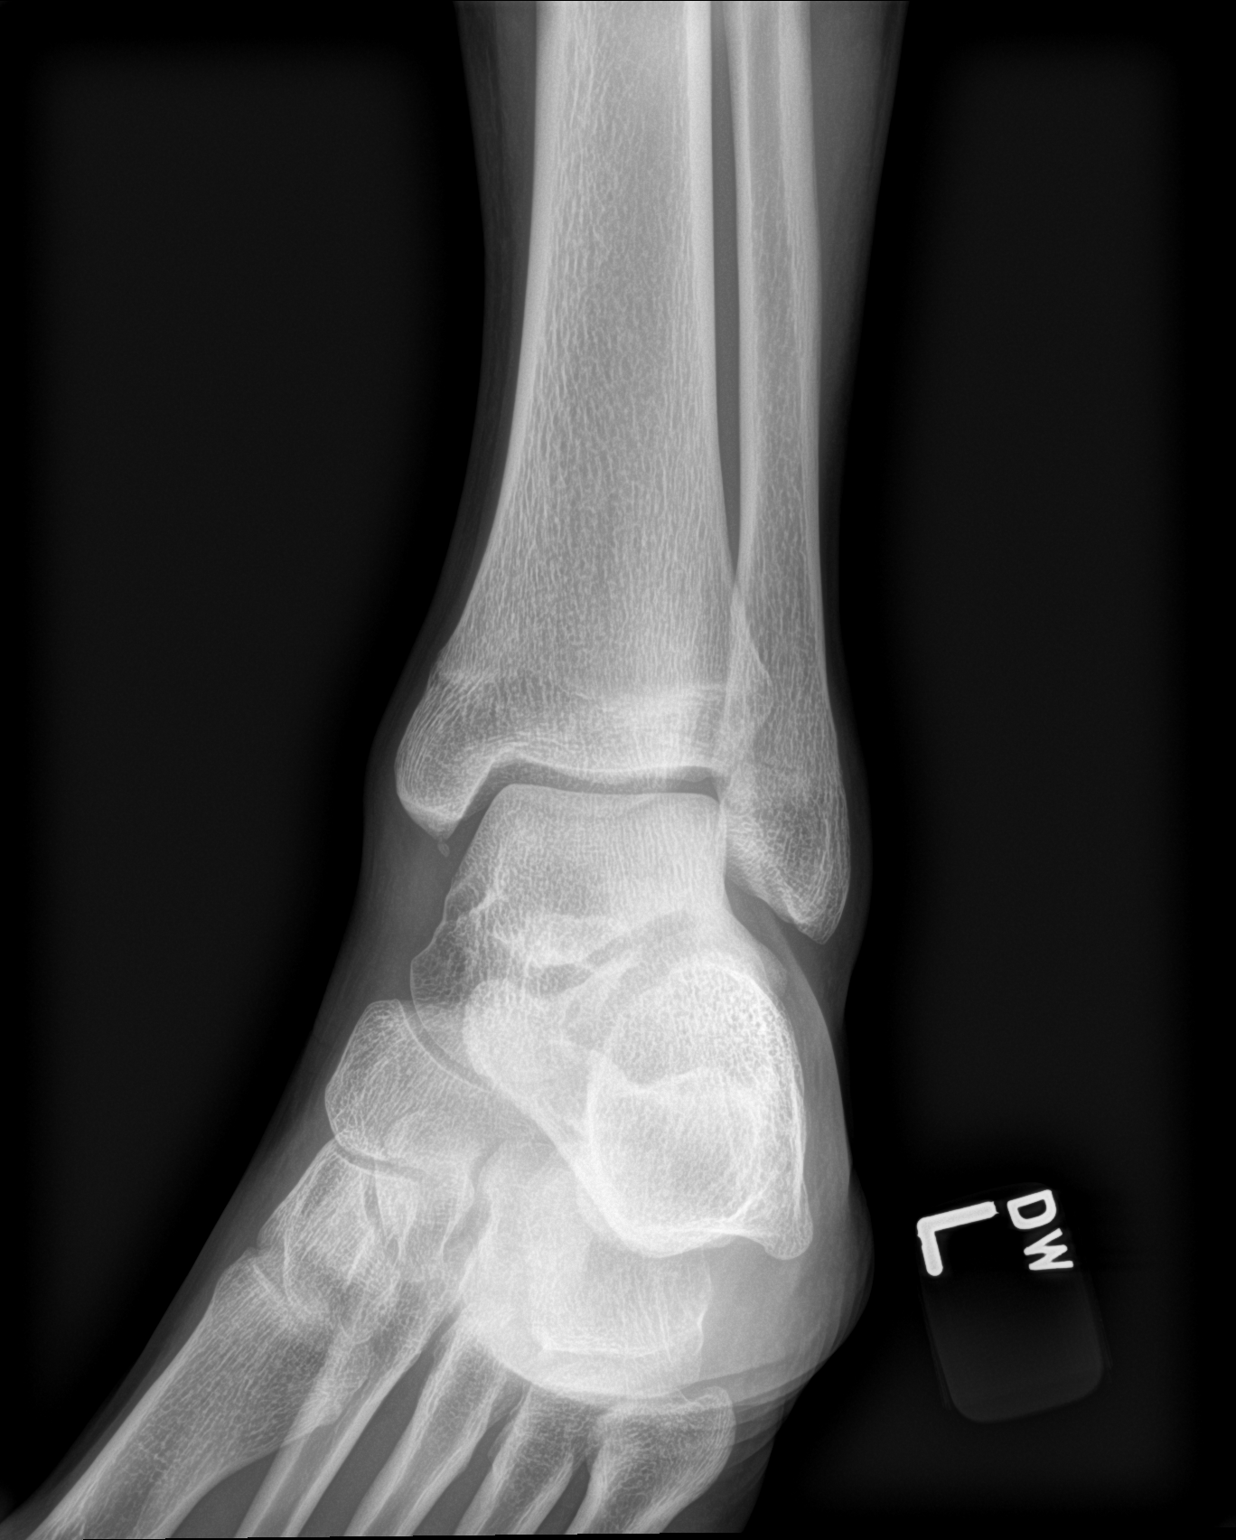

[ankle lat]
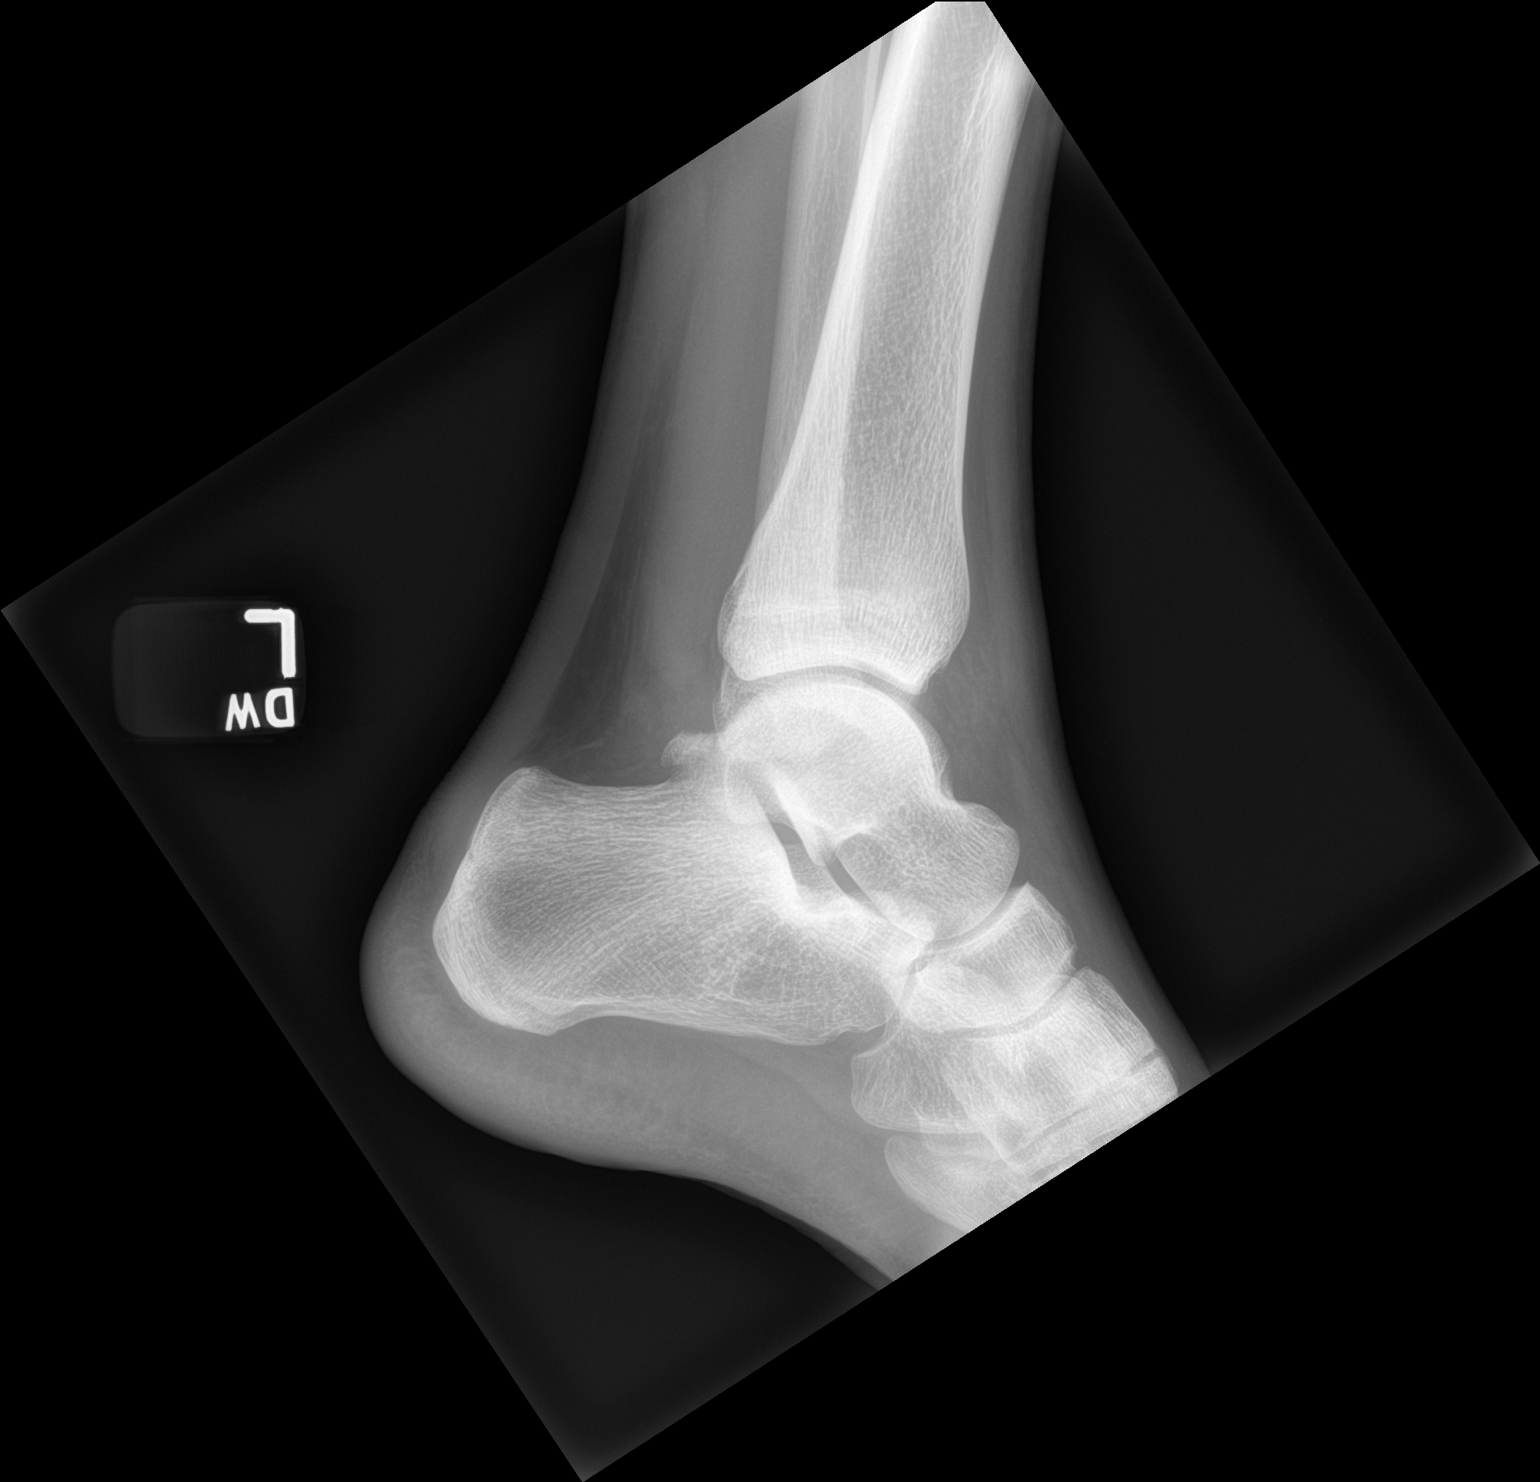

[3 of 3 positions shown; findings below may reference images not displayed]

FINDINGS: There is no evidence of fracture, dislocation, or joint effusion. A
corticated bony density is seen inferior to the medial malleolus,
likely chronic. There is no evidence of arthropathy or other focal
bone abnormality. Mild soft tissue swelling is present about the
ankle.
IMPRESSION: 1. No acute fracture or dislocation.
2. Corticated bony density inferior to the medial malleolus, likely
related to old trauma.

## 2022-12-27 ENCOUNTER — Other Ambulatory Visit (HOSPITAL_BASED_OUTPATIENT_CLINIC_OR_DEPARTMENT_OTHER): Payer: Self-pay

## 2023-01-01 ENCOUNTER — Ambulatory Visit (INDEPENDENT_AMBULATORY_CARE_PROVIDER_SITE_OTHER): Payer: 59 | Admitting: Family Medicine

## 2023-01-01 ENCOUNTER — Other Ambulatory Visit (HOSPITAL_BASED_OUTPATIENT_CLINIC_OR_DEPARTMENT_OTHER): Payer: Self-pay

## 2023-01-01 ENCOUNTER — Other Ambulatory Visit (HOSPITAL_BASED_OUTPATIENT_CLINIC_OR_DEPARTMENT_OTHER): Payer: Self-pay | Admitting: Family Medicine

## 2023-01-01 ENCOUNTER — Encounter (HOSPITAL_BASED_OUTPATIENT_CLINIC_OR_DEPARTMENT_OTHER): Payer: Self-pay

## 2023-01-01 ENCOUNTER — Encounter (HOSPITAL_BASED_OUTPATIENT_CLINIC_OR_DEPARTMENT_OTHER): Payer: Self-pay | Admitting: Family Medicine

## 2023-01-01 DIAGNOSIS — F902 Attention-deficit hyperactivity disorder, combined type: Secondary | ICD-10-CM

## 2023-01-01 MED ORDER — LISDEXAMFETAMINE DIMESYLATE 70 MG PO CAPS
70.0000 mg | ORAL_CAPSULE | ORAL | 0 refills | Status: DC
Start: 2023-01-01 — End: 2023-01-30
  Filled 2023-01-01: qty 30, 30d supply, fill #0

## 2023-01-01 NOTE — Telephone Encounter (Signed)
Routing this over to Dr. De Peru for him to handle if patient keeps his appt today 9/17.

## 2023-01-01 NOTE — Progress Notes (Signed)
    Procedures performed today:    None.  Independent interpretation of notes and tests performed by another provider:   None.  Brief History, Exam, Impression, and Recommendations:    BP 128/84 (BP Location: Right Arm, Patient Position: Sitting, Cuff Size: Normal)   Pulse (!) 58   Ht 6\' 1"  (1.854 m)   Wt 157 lb 11.2 oz (71.5 kg)   SpO2 98%   BMI 20.81 kg/m   ADHD (attention deficit hyperactivity disorder), combined type Assessment & Plan: Patient doing well at present with use of Vyvanse and guanfacine.  He is requesting refill of Vyvanse today.  Denies any issues with sleep concerns, palpitations, appetite concerns.  PDMP reviewed, no red flags.  Refill of Vyvanse provided today.  Orders: -     Lisdexamfetamine Dimesylate; Take 1 capsule (70 mg total) by mouth every morning.  Dispense: 30 capsule; Refill: 0  Return in about 3 months (around 04/02/2023) for med check, can be virtual.   ___________________________________________ Laparis Durrett de [place], MD, ABFM, CAQSM Primary Care and Sports Medicine [facility]

## 2023-01-01 NOTE — Assessment & Plan Note (Signed)
Patient doing well at present with use of Vyvanse and guanfacine.  He is requesting refill of Vyvanse today.  Denies any issues with sleep concerns, palpitations, appetite concerns.  PDMP reviewed, no red flags.  Refill of Vyvanse provided today.

## 2023-01-29 ENCOUNTER — Other Ambulatory Visit (HOSPITAL_BASED_OUTPATIENT_CLINIC_OR_DEPARTMENT_OTHER): Payer: Self-pay

## 2023-01-29 ENCOUNTER — Other Ambulatory Visit (HOSPITAL_BASED_OUTPATIENT_CLINIC_OR_DEPARTMENT_OTHER): Payer: Self-pay | Admitting: Family Medicine

## 2023-01-29 ENCOUNTER — Encounter (HOSPITAL_BASED_OUTPATIENT_CLINIC_OR_DEPARTMENT_OTHER): Payer: Self-pay | Admitting: Family Medicine

## 2023-01-29 DIAGNOSIS — F902 Attention-deficit hyperactivity disorder, combined type: Secondary | ICD-10-CM

## 2023-01-30 ENCOUNTER — Other Ambulatory Visit (HOSPITAL_BASED_OUTPATIENT_CLINIC_OR_DEPARTMENT_OTHER): Payer: Self-pay

## 2023-01-30 ENCOUNTER — Other Ambulatory Visit (HOSPITAL_BASED_OUTPATIENT_CLINIC_OR_DEPARTMENT_OTHER): Payer: Self-pay | Admitting: *Deleted

## 2023-01-30 ENCOUNTER — Telehealth (HOSPITAL_BASED_OUTPATIENT_CLINIC_OR_DEPARTMENT_OTHER): Payer: Self-pay | Admitting: Family Medicine

## 2023-01-30 DIAGNOSIS — F902 Attention-deficit hyperactivity disorder, combined type: Secondary | ICD-10-CM

## 2023-01-30 MED ORDER — LISDEXAMFETAMINE DIMESYLATE 70 MG PO CAPS
70.0000 mg | ORAL_CAPSULE | ORAL | 0 refills | Status: DC
Start: 2023-01-30 — End: 2023-02-17
  Filled 2023-01-30: qty 30, 30d supply, fill #0

## 2023-01-30 NOTE — Telephone Encounter (Signed)
Dad called to complain, that we never have the medication sent down when requested, what else do we need to do to get this corrected.  I explained to the Dad, that Macon was doing everything that he should do, the medication will not be released until the 30 days.

## 2023-02-17 ENCOUNTER — Other Ambulatory Visit (HOSPITAL_BASED_OUTPATIENT_CLINIC_OR_DEPARTMENT_OTHER): Payer: Self-pay | Admitting: Family Medicine

## 2023-02-17 DIAGNOSIS — F902 Attention-deficit hyperactivity disorder, combined type: Secondary | ICD-10-CM

## 2023-02-18 ENCOUNTER — Other Ambulatory Visit (HOSPITAL_BASED_OUTPATIENT_CLINIC_OR_DEPARTMENT_OTHER): Payer: Self-pay

## 2023-02-18 ENCOUNTER — Other Ambulatory Visit: Payer: Self-pay

## 2023-02-18 MED ORDER — LISDEXAMFETAMINE DIMESYLATE 70 MG PO CAPS
70.0000 mg | ORAL_CAPSULE | ORAL | 0 refills | Status: DC
  Filled 2023-02-22 – 2023-02-27 (×2): qty 30, 30d supply, fill #0

## 2023-02-22 ENCOUNTER — Other Ambulatory Visit (HOSPITAL_BASED_OUTPATIENT_CLINIC_OR_DEPARTMENT_OTHER): Payer: Self-pay

## 2023-02-27 ENCOUNTER — Other Ambulatory Visit (HOSPITAL_BASED_OUTPATIENT_CLINIC_OR_DEPARTMENT_OTHER): Payer: Self-pay

## 2023-03-07 ENCOUNTER — Encounter (HOSPITAL_BASED_OUTPATIENT_CLINIC_OR_DEPARTMENT_OTHER): Payer: Self-pay | Admitting: Family Medicine

## 2023-03-24 ENCOUNTER — Other Ambulatory Visit (HOSPITAL_BASED_OUTPATIENT_CLINIC_OR_DEPARTMENT_OTHER): Payer: Self-pay | Admitting: Family Medicine

## 2023-03-24 DIAGNOSIS — F902 Attention-deficit hyperactivity disorder, combined type: Secondary | ICD-10-CM

## 2023-03-25 ENCOUNTER — Other Ambulatory Visit: Payer: Self-pay

## 2023-03-25 ENCOUNTER — Other Ambulatory Visit (HOSPITAL_BASED_OUTPATIENT_CLINIC_OR_DEPARTMENT_OTHER): Payer: Self-pay

## 2023-03-25 MED ORDER — LISDEXAMFETAMINE DIMESYLATE 70 MG PO CAPS
70.0000 mg | ORAL_CAPSULE | ORAL | 0 refills | Status: DC
  Filled 2023-03-25 – 2023-03-27 (×2): qty 30, 30d supply, fill #0
  Filled ????-??-??: fill #0

## 2023-03-27 ENCOUNTER — Other Ambulatory Visit (HOSPITAL_BASED_OUTPATIENT_CLINIC_OR_DEPARTMENT_OTHER): Payer: Self-pay

## 2023-04-03 ENCOUNTER — Ambulatory Visit (HOSPITAL_BASED_OUTPATIENT_CLINIC_OR_DEPARTMENT_OTHER): Payer: 59 | Admitting: Family Medicine

## 2023-04-03 ENCOUNTER — Telehealth: Payer: Self-pay

## 2023-04-03 NOTE — Telephone Encounter (Signed)
This message was sent to [facility] in [place].

## 2023-04-03 NOTE — Telephone Encounter (Signed)
Copied from CRM [id number]. Topic: Appointments - Appointment Info/Confirmation >> Apr 03, 2023 11:47 AM Antony Haste wrote: Patient/patient representative is calling for information regarding an appointment. Pt father states that PT will be unable to attend scheduled appointment on 12/19 at 1:10p with his PCP for 7-month follow-up, Pt father states he would like to reschedule, PT only is available after 2:00pm on weekdays, and PT will be unavailable from 12/24-01/03. Pt father would like to discuss further with PT's PCP about rescheduling.  CB#:[phone]

## 2023-04-04 ENCOUNTER — Ambulatory Visit (HOSPITAL_BASED_OUTPATIENT_CLINIC_OR_DEPARTMENT_OTHER): Payer: 59 | Admitting: Family Medicine

## 2023-04-18 ENCOUNTER — Other Ambulatory Visit (HOSPITAL_BASED_OUTPATIENT_CLINIC_OR_DEPARTMENT_OTHER): Payer: Self-pay | Admitting: Family Medicine

## 2023-04-19 ENCOUNTER — Other Ambulatory Visit (HOSPITAL_BASED_OUTPATIENT_CLINIC_OR_DEPARTMENT_OTHER): Payer: Self-pay

## 2023-04-19 MED ORDER — FLUOXETINE HCL 20 MG PO CAPS
20.0000 mg | ORAL_CAPSULE | Freq: Every day | ORAL | 1 refills | Status: DC
  Filled 2023-04-19: qty 30, 30d supply, fill #0
  Filled 2023-05-26: qty 30, 30d supply, fill #1
  Filled 2023-06-25: qty 30, 30d supply, fill #2
  Filled 2023-07-28: qty 30, 30d supply, fill #3
  Filled 2023-08-06 – 2023-08-30 (×2): qty 30, 30d supply, fill #4
  Filled 2023-09-29: qty 30, 30d supply, fill #5

## 2023-04-21 ENCOUNTER — Other Ambulatory Visit (HOSPITAL_BASED_OUTPATIENT_CLINIC_OR_DEPARTMENT_OTHER): Payer: Self-pay | Admitting: Family Medicine

## 2023-04-21 DIAGNOSIS — F902 Attention-deficit hyperactivity disorder, combined type: Secondary | ICD-10-CM

## 2023-04-22 ENCOUNTER — Encounter (HOSPITAL_BASED_OUTPATIENT_CLINIC_OR_DEPARTMENT_OTHER): Payer: Self-pay | Admitting: Family Medicine

## 2023-04-22 ENCOUNTER — Other Ambulatory Visit (HOSPITAL_BASED_OUTPATIENT_CLINIC_OR_DEPARTMENT_OTHER): Payer: Self-pay

## 2023-04-22 ENCOUNTER — Ambulatory Visit (HOSPITAL_BASED_OUTPATIENT_CLINIC_OR_DEPARTMENT_OTHER): Payer: 59 | Admitting: Family Medicine

## 2023-04-22 VITALS — BP 114/71 | HR 88 | Temp 98.0°F | Ht 73.0 in | Wt 157.0 lb

## 2023-04-22 DIAGNOSIS — F902 Attention-deficit hyperactivity disorder, combined type: Secondary | ICD-10-CM | POA: Diagnosis not present

## 2023-04-22 DIAGNOSIS — S6990XA Unspecified injury of unspecified wrist, hand and finger(s), initial encounter: Secondary | ICD-10-CM | POA: Insufficient documentation

## 2023-04-22 DIAGNOSIS — S6991XA Unspecified injury of right wrist, hand and finger(s), initial encounter: Secondary | ICD-10-CM | POA: Diagnosis not present

## 2023-04-22 MED ORDER — LISDEXAMFETAMINE DIMESYLATE 70 MG PO CAPS
70.0000 mg | ORAL_CAPSULE | ORAL | 0 refills | Status: DC
  Filled 2023-04-29: qty 30, 30d supply, fill #0

## 2023-04-22 NOTE — Progress Notes (Signed)
    Procedures performed today:    None.  Independent interpretation of notes and tests performed by another provider:   None.  Brief History, Exam, Impression, and Recommendations:    BP 114/71 (BP Location: Left Arm, Patient Position: Sitting, Cuff Size: Normal)   Pulse 88   Ht 6' 1 (1.854 m)   Wt 157 lb (71.2 kg)   SpO2 99%   BMI 20.71 kg/m   ADHD (attention deficit hyperactivity disorder), combined type Assessment & Plan: Patient doing well at present with use of Vyvanse  and guanfacine .  He will be needing Vyvanse  refilled about a week or so.  Denies any issues with sleep concerns, palpitations, appetite concerns.  PDMP reviewed, no red flags.  Refill of Vyvanse  provided today.   Injury of finger of right hand, initial encounter Assessment & Plan: Reports that a couple days ago he was at a trampoline park and went to dunk a basketball and right index finger bent back on the rim.  He had subsequent pain and swelling in the finger which persist today.  He does have normal movement of the finger, however some restriction in range of motion due to the swelling.  Not aware of specific injuries in the past. On exam, right index finger with swelling predominantly around PIP and DIP to a lesser extent.  He does have good strength at MCP, PIP, DIP joints in isolation.  Slightly reduced range of motion, more so for flexion due to observed swelling. Suspect sprain of finger at this time, no evidence of tendon rupture or avulsion based on exam.  Discussed options including possible imaging to exclude occult fracture, patient would prefer to monitor for now and will let us  know if symptoms or not improving as expected.  If continuing to have symptoms, would proceed with initial x-rays   Return in about 3 months (around 07/21/2023) for med check.   ___________________________________________ Oneda Duffett de [place], MD, ABFM, CAQSM Primary Care and Sports Medicine [facility]

## 2023-04-22 NOTE — Patient Instructions (Signed)
  Medication Instructions:  Your physician recommends that you continue on your current medications as directed. Please refer to the Current Medication list given to you today. --If you need a refill on any your medications before your next appointment, please call your pharmacy first. If no refills are authorized on file call the office.-- Lab Work: Your physician has recommended that you have lab work today: no If you have labs (blood work) drawn today and your tests are completely normal, you will receive your results via MyChart message OR a phone call from our staff.  Please ensure you check your voicemail in the event that you authorized detailed messages to be left on a delegated number. If you have any lab test that is abnormal or we need to change your treatment, we will call you to review the results.  Referrals/Procedures/Imaging: no  Follow-Up: Your next appointment:   Your physician recommends that you schedule a follow-up appointment in: 3 months with Dr. de Peru  You will receive a text message or e-mail with a link to a survey about your care and experience with [place] today! We would greatly appreciate your feedback!   Thanks for letting us be apart of your health journey!!  Primary Care and Sports Medicine   Dr. Ceasar Mons Peru   We encourage you to activate your patient portal called "MyChart".  Sign up information is provided on this After Visit Summary.  MyChart is used to connect with patients for Virtual Visits (Telemedicine).  Patients are able to view lab/test results, encounter notes, upcoming appointments, etc.  Non-urgent messages can be sent to your provider as well. To learn more about what you can do with MyChart, please visit --  [email or web address].

## 2023-04-22 NOTE — Assessment & Plan Note (Signed)
 Patient doing well at present with use of Vyvanse  and guanfacine .  He will be needing Vyvanse  refilled about a week or so.  Denies any issues with sleep concerns, palpitations, appetite concerns.  PDMP reviewed, no red flags.  Refill of Vyvanse  provided today.

## 2023-04-22 NOTE — Assessment & Plan Note (Signed)
 Reports that a couple days ago he was at a trampoline park and went to dunk a basketball and right index finger bent back on the rim.  He had subsequent pain and swelling in the finger which persist today.  He does have normal movement of the finger, however some restriction in range of motion due to the swelling.  Not aware of specific injuries in the past. On exam, right index finger with swelling predominantly around PIP and DIP to a lesser extent.  He does have good strength at MCP, PIP, DIP joints in isolation.  Slightly reduced range of motion, more so for flexion due to observed swelling. Suspect sprain of finger at this time, no evidence of tendon rupture or avulsion based on exam.  Discussed options including possible imaging to exclude occult fracture, patient would prefer to monitor for now and will let us  know if symptoms or not improving as expected.  If continuing to have symptoms, would proceed with initial x-rays

## 2023-04-29 ENCOUNTER — Other Ambulatory Visit (HOSPITAL_BASED_OUTPATIENT_CLINIC_OR_DEPARTMENT_OTHER): Payer: Self-pay

## 2023-04-29 ENCOUNTER — Other Ambulatory Visit: Payer: Self-pay

## 2023-05-01 ENCOUNTER — Other Ambulatory Visit (HOSPITAL_BASED_OUTPATIENT_CLINIC_OR_DEPARTMENT_OTHER): Payer: Self-pay

## 2023-05-08 ENCOUNTER — Ambulatory Visit (HOSPITAL_BASED_OUTPATIENT_CLINIC_OR_DEPARTMENT_OTHER): Payer: 59 | Admitting: Family Medicine

## 2023-05-08 ENCOUNTER — Ambulatory Visit (HOSPITAL_BASED_OUTPATIENT_CLINIC_OR_DEPARTMENT_OTHER): Payer: Self-pay | Admitting: Family Medicine

## 2023-05-08 ENCOUNTER — Encounter (HOSPITAL_BASED_OUTPATIENT_CLINIC_OR_DEPARTMENT_OTHER): Payer: Self-pay | Admitting: Family Medicine

## 2023-05-08 ENCOUNTER — Ambulatory Visit (HOSPITAL_BASED_OUTPATIENT_CLINIC_OR_DEPARTMENT_OTHER): Payer: 59

## 2023-05-08 VITALS — BP 110/62 | HR 67 | Ht 73.0 in | Wt 158.0 lb

## 2023-05-08 DIAGNOSIS — S6991XD Unspecified injury of right wrist, hand and finger(s), subsequent encounter: Secondary | ICD-10-CM

## 2023-05-08 NOTE — Progress Notes (Signed)
    Procedures performed today:    None.  Independent interpretation of notes and tests performed by another provider:   None.  Brief History, Exam, Impression, and Recommendations:    BP 110/62   Pulse 67   Ht 6\' 1"  (1.854 m)   Wt 158 lb (71.7 kg)   SpO2 99%   BMI 20.85 kg/m   Injury of finger of right hand, subsequent encounter Assessment & Plan: Patient last seen about 2 weeks ago related to finger injury.  Plan at that time was for monitoring with consideration for x-ray imaging should symptoms persist.  He indicates that he still having some pain and swelling with the affected finger.  We can proceed with x-ray imaging at this time, patient will go downstairs to have these done today.  For now, can continue with conservative measures, rest, icing, OTC medications as needed.  We will manage accordingly once x-ray results obtained  Orders: -     DG Finger Index Right; Future   ___________________________________________ Maclean Foister de [place], MD, ABFM, CAQSM Primary Care and Sports Medicine [facility]

## 2023-05-08 NOTE — Telephone Encounter (Addendum)
Copied From CRM [id number]. Reason for Triage: patient answer the question in the decision tree if something they have already seen their pcp for and its getting worse prop to send message to nurse triage very tender and hard to flex and make a fist where the finger bend the right index finger   01/22- Called placed to follow-up, no answer, lvm  01/22 12:29p- 2nd attempt, no answer, LVM  01/22 01:49- 3rd attempt, no answer, LVM

## 2023-05-08 NOTE — Telephone Encounter (Signed)
Copied from CRM [id number]. Topic: Appointments - Scheduling Inquiry for Clinic >> May 08, 2023 11:35 AM Joshua Ashley wrote: Reason for CRM: This patient is wanting to schedule an x-ray with his PCP for his right finger, he states he spoke to someone previously about this. He would like to determine if this can be scheduled sometime this week.   Made pt appt

## 2023-05-08 NOTE — Assessment & Plan Note (Signed)
Patient last seen about 2 weeks ago related to finger injury.  Plan at that time was for monitoring with consideration for x-ray imaging should symptoms persist.  He indicates that he still having some pain and swelling with the affected finger.  We can proceed with x-ray imaging at this time, patient will go downstairs to have these done today.  For now, can continue with conservative measures, rest, icing, OTC medications as needed.  We will manage accordingly once x-ray results obtained

## 2023-05-15 ENCOUNTER — Ambulatory Visit (HOSPITAL_BASED_OUTPATIENT_CLINIC_OR_DEPARTMENT_OTHER): Payer: Self-pay | Admitting: Family Medicine

## 2023-05-15 NOTE — Telephone Encounter (Signed)
noted

## 2023-05-15 NOTE — Telephone Encounter (Signed)
Copied from CRM [id number]. Topic: Clinical - Red Word Triage >> May 15, 2023  3:22 PM Dimitri Ped wrote: [facility] that prompted transfer to Nurse Triage: patient mom is calling cause patient has the flu with fever and body aches.  Chief Complaint: fever Symptoms: fever 100.9, 5/10 headache at top of head, body aches, chills, sore throat cough, congestion in throat Frequency: yesterday Pertinent Negatives: Patient denies SOB Disposition: [] ED /[] Urgent Care (no appt availability in office) / [] Appointment(In office/virtual)/ [x]  [facility]/ [] Home Care/ [] Refused Recommended Disposition /[]  Mobile Bus/ []  Follow-up with PCP Additional Notes:  pt/mom is requesting tamiflu  Reason for Disposition  [1] Fever comes and goes (intermittent) AND [2] lasts > 3 weeks  Answer Assessment - Initial Assessment Questions 1. TEMPERATURE: "What is the most recent temperature?"  "How was it measured?"      100.9 2. ONSET: "When did the fever start?"      yesterday 3. CHILLS: "Do you have chills?" If yes: "How bad are they?"  (e.g., none, mild, moderate, severe)   - NONE: no chills   - MILD: feeling cold   - MODERATE: feeling very cold, some shivering (feels better under a thick blanket)   - SEVERE: feeling extremely cold with shaking chills (general body shaking, rigors; even under a thick blanket)      Mild to moderate 4. OTHER SYMPTOMS: "Do you have any other symptoms besides the fever?"  (e.g., abdomen pain, cough, diarrhea, earache, headache, sore throat, urination pain)   ever 100.9, 5/10 headache at top of head, body aches, chills, sore throat cough, congestion in throat  5. CAUSE: If there are no symptoms, ask: "What do you think is causing the fever?"      unknwown 6. CONTACTS: "Does anyone else in the family have an infection?"     no 7. TREATMENT: "What have you done so far to treat this fever?" (e.g., medications)     unknown 8. IMMUNOCOMPROMISE: "Do you have of the  following: diabetes, HIV positive, splenectomy, cancer chemotherapy, chronic steroid treatment, transplant patient, etc."     N/a 9. PREGNANCY: "Is there any chance you are pregnant?" "When was your last menstrual period?"     N/a 10. TRAVEL: "Have you traveled out of the country in the last month?" (e.g., travel history, exposures)    N/a  Protocols used: [facility]

## 2023-05-16 ENCOUNTER — Telehealth: Payer: 59 | Admitting: Family Medicine

## 2023-05-16 ENCOUNTER — Ambulatory Visit (HOSPITAL_BASED_OUTPATIENT_CLINIC_OR_DEPARTMENT_OTHER): Payer: 59

## 2023-05-16 ENCOUNTER — Other Ambulatory Visit (HOSPITAL_BASED_OUTPATIENT_CLINIC_OR_DEPARTMENT_OTHER): Payer: Self-pay

## 2023-05-16 DIAGNOSIS — R6889 Other general symptoms and signs: Secondary | ICD-10-CM

## 2023-05-16 MED ORDER — OSELTAMIVIR PHOSPHATE 75 MG PO CAPS
75.0000 mg | ORAL_CAPSULE | Freq: Two times a day (BID) | ORAL | 0 refills | Status: AC
  Filled 2023-05-16: qty 10, 5d supply, fill #0

## 2023-05-16 NOTE — Progress Notes (Signed)
Virtual Visit Consent   Joshua Ashley, you are scheduled for a virtual visit with a [facility] provider today. Just as with appointments in the office, your consent must be obtained to participate. Your consent will be active for this visit and any virtual visit you may have with one of our providers in the next 365 days. If you have a MyChart account, a copy of this consent can be sent to you electronically.  As this is a virtual visit, video technology does not allow for your provider to perform a traditional examination. This may limit your provider's ability to fully assess your condition. If your provider identifies any concerns that need to be evaluated in person or the need to arrange testing (such as labs, EKG, etc.), we will make arrangements to do so. Although advances in technology are sophisticated, we cannot ensure that it will always work on either your end or our end. If the connection with a video visit is poor, the visit may have to be switched to a telephone visit. With either a video or telephone visit, we are not always able to ensure that we have a secure connection.  By engaging in this virtual visit, you consent to the provision of healthcare and authorize for your insurance to be billed (if applicable) for the services provided during this visit. Depending on your insurance coverage, you may receive a charge related to this service.  I need to obtain your verbal consent now. Are you willing to proceed with your visit today? [name] has provided verbal consent on 05/16/2023 for a virtual visit (video or telephone). Freddy Finner, NP  Date: 05/16/2023 9:04 AM  Virtual Visit via Video Note   I, Freddy Finner, connected with  Joshua Ashley  ([id number], August 13, 2000) on 05/16/23 at  9:00 AM EST by a video-enabled telemedicine application and verified that I am speaking with the correct person using two identifiers.  Location: Patient: Virtual Visit Location  Patient: Home Provider: Virtual Visit Location Provider: Home Office   I discussed the limitations of evaluation and management by telemedicine and the availability of in person appointments. The patient expressed understanding and agreed to proceed.    History of Present Illness: Joshua Ashley is a 23 y.o. who identifies as a male who was assigned male at birth, and is being seen today for flu like symptoms  Onset was Tuesday- with cough, bodyaches- back and legs Associated symptoms are fever of 100.5, congestion and sore throat, fatigue  Modifying factors are ibuprofen, tylenol, humidifier, mucinex Denies chest pain, shortness of breath  Exposure to sick contacts- unknown  COVID test: no Vaccines: not up to date    Problems:  Patient Active Problem List   Diagnosis Date Noted   Finger injury 04/22/2023   Anxiety 09/13/2022   Great toe pain, right 09/13/2022   Contact dermatitis 09/13/2022   Vaping nicotine dependence, tobacco product 09/13/2022   Left ankle sprain 09/27/2021   Acne 08/02/2021   ADHD (attention deficit hyperactivity disorder), combined type 07/06/2015   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 (Patient not taking: Reported on 05/08/2023), Disp: 45 g, Rfl: 1   Benzoyl Peroxide 2.5 % CREA, Apply 1 application. topically daily. (Patient not  taking: Reported on 05/08/2023), Disp: 21 g, Rfl: 2   FLUoxetine (PROZAC) 20 MG capsule, Take 1 capsule (20 mg total) by mouth daily., Disp: 90 capsule, Rfl: 1   GuanFACINE HCl 3 MG TB24, Take 1 tablet (3 mg total) by mouth daily., Disp: 90 tablet, Rfl: 1   lisdexamfetamine (VYVANSE) 70 MG capsule, Take 1 capsule (70 mg total) by mouth every morning., Disp: 30  capsule, Rfl: 0   loratadine (CLARITIN) 10 MG tablet, Take 10 mg by mouth daily., Disp: , Rfl:    omeprazole (PRILOSEC) 20 MG capsule, Take by mouth daily., Disp: , Rfl: 6   triamcinolone cream (KENALOG) 0.1 %, Apply 1 Application topically 2 (two) times daily. (Patient not taking: Reported on 05/08/2023), Disp: 30 g, Rfl: 0  Observations/Objective: Patient is well-developed, well-nourished in no acute distress.  Resting comfortably  at home.  Head is normocephalic, atraumatic.  No labored breathing.  Speech is clear and coherent with logical content.  Patient is alert and oriented at baseline.    Assessment and Plan:    1. Flu-like symptoms (Primary)  - oseltamivir (TAMIFLU) 75 MG capsule; Take 1 capsule (75 mg total) by mouth 2 (two) times daily for 5 days.  Dispense: 10 capsule; Refill: 0  - Continue OTC symptomatic management of choice  - Take prescribed medications as directed - Push fluids - Rest as needed - Discussed return precautions and when to seek in-person evaluation, sent via AVS as well   Reviewed side effects, risks and benefits of medication.    Patient acknowledged agreement and understanding of the plan.   Past Medical, Surgical, Social History, Allergies, and Medications have been Reviewed.   Follow Up Instructions: I discussed the assessment and treatment plan with the patient. The patient was provided an opportunity to ask questions and all were answered. The patient agreed with the plan and demonstrated an understanding of the instructions.  A copy of instructions were sent to the patient via MyChart unless otherwise noted below.    The patient was advised to call back or seek an in-person evaluation if the symptoms worsen or if the condition fails to improve as anticipated.    Freddy Finner, NP

## 2023-05-16 NOTE — Patient Instructions (Addendum)
Joshua Ashley, thank you for joining Freddy Finner, NP for today's virtual visit.  While this provider is not your primary care provider (PCP), if your PCP is located in our provider database this encounter information will be shared with them immediately following your visit.   A [facility] MyChart account gives you access to today's visit and all your visits, tests, and labs performed at [facility] " click here if you don't have a [facility] MyChart account or go to [email or web address]  Consent: (Patient) Joshua Ashley provided verbal consent for this virtual visit at the beginning of the encounter.  Current Medications:  Current Outpatient Medications:    adapalene (DIFFERIN) 0.1 % cream, Apply topically at bedtime. (Patient not taking: Reported on 05/08/2023), Disp: 45 g, Rfl: 1   Benzoyl Peroxide 2.5 % CREA, Apply 1 application. topically daily. (Patient not taking: Reported on 05/08/2023), Disp: 21 g, Rfl: 2   FLUoxetine (PROZAC) 20 MG capsule, Take 1 capsule (20 mg total) by mouth daily., Disp: 90 capsule, Rfl: 1   GuanFACINE HCl 3 MG TB24, Take 1 tablet (3 mg total) by mouth daily., Disp: 90 tablet, Rfl: 1   lisdexamfetamine (VYVANSE) 70 MG capsule, Take 1 capsule (70 mg total) by mouth every morning., Disp: 30 capsule, Rfl: 0   loratadine (CLARITIN) 10 MG tablet, Take 10 mg by mouth daily., Disp: , Rfl:    omeprazole (PRILOSEC) 20 MG capsule, Take by mouth daily., Disp: , Rfl: 6   triamcinolone cream (KENALOG) 0.1 %, Apply 1 Application topically 2 (two) times daily. (Patient not taking: Reported on 05/08/2023), Disp: 30 g, Rfl: 0   Medications ordered in this encounter:  No orders of the defined types were placed in this encounter.    *If you need refills on other medications prior to your next appointment, please contact your pharmacy*  Follow-Up: Call back or seek an in-person evaluation if the symptoms worsen or if the condition fails to improve as  anticipated.  [facility] [phone]  Other Instructions  Influenza, Adult Influenza is also called the flu. It's an infection that affects your respiratory tract. This includes your nose, throat, windpipe, and lungs. The flu is contagious. This means it spreads easily from person to person. It causes symptoms that are like a cold. It can also cause a high fever and body aches. What are the causes? The flu is caused by the influenza virus. You can get it by: Breathing in droplets that are in the air after an infected person coughs or sneezes. Touching something that has the virus on it and then touching your mouth, nose, or eyes. What increases the risk? You may be more likely to get the flu if: You don't wash your hands often. You're near a lot of people during cold and flu season. You touch your mouth, eyes, or nose without washing your hands first. You don't get a flu shot each year. You may also be more at risk for the flu and serious problems, such as a lung infection called pneumonia, if: You're older than 65. You're pregnant. Your immune system is weak. Your immune system is your body's defense system. You have a long-term, or chronic, condition, such as: Heart, kidney, or lung disease. Diabetes. A liver disorder. Asthma. You're very overweight. You have anemia. This is when you don't have enough red blood cells in your body. What are the signs or symptoms? Flu symptoms often start all of a sudden. They  may last 4-14 days and include: Fever and chills. Headaches, body aches, or muscle aches. Sore throat. Cough. Runny or stuffy nose. Discomfort in your chest. Not wanting to eat as much as normal. Feeling weak or tired. Feeling dizzy. Nausea or vomiting. How is this diagnosed? The flu may be diagnosed based on your symptoms and medical history. You may also have a physical exam. A swab may be taken from your nose or throat and tested for the  virus. How is this treated? If the flu is found early, you can be treated with antiviral medicine. This may be given to you by mouth or through an IV. It can help you feel less sick and get better faster. Taking care of yourself at home can also help your symptoms get better. Your health care provider may tell you to: Take over-the-counter medicines. Drink lots of fluids. The flu often goes away on its own. If you have very bad symptoms or problems caused by the flu, you may need to be treated in a hospital. Follow these instructions at home: Activity Rest as needed. Get lots of sleep. Stay home from work or school as told by your provider. Leave home only to go see your provider. Do not leave home for other reasons until you don't have a fever for 24 hours without taking medicine. Eating and drinking Take an oral rehydration solution (ORS). This is a drink that is sold at pharmacies and stores. Drink enough fluid to keep your pee pale yellow. Try to drink small amounts of clear fluids. These include water, ice chips, fruit juice mixed with water, and low-calorie sports drinks. Try to eat bland foods that are easy to digest. These include bananas, applesauce, rice, lean meats, toast, and crackers. Avoid drinks that have a lot of sugar or caffeine in them. These include energy drinks, regular sports drinks, and soda. Do not drink alcohol. Do not eat spicy or fatty foods. General instructions     Take your medicines only as told by your provider. Use a cool mist humidifier to add moisture to the air in your home. This can make it easier for you to breathe. You should also clean the humidifier every day. To do so: Empty the water. Pour clean water in. Cover your mouth and nose when you cough or sneeze. Wash your hands with soap and water often and for at least 20 seconds. It's extra important to do so after you cough or sneeze. If you can't use soap and water, use hand sanitizer. How is  this prevented?  Get a flu shot every year. Ask your provider when you should get your flu shot. Stay away from people who are sick during fall and winter. Fall and winter are cold and flu season. Contact a health care provider if: You get new symptoms. You have chest pain. You have watery poop, also called diarrhea. You have a fever. Your cough gets worse. You start to have more mucus. You feel like you may vomit, or you vomit. Get help right away if: You become short of breath or have trouble breathing. Your skin or nails turn blue. You have very bad pain or stiffness in your neck. You get a sudden headache or pain in your face or ear. You vomit each time you eat or drink. These symptoms may be an emergency. Call 911 right away. Do not wait to see if the symptoms will go away. Do not drive yourself to the hospital. This information is  not intended to replace advice given to you by your health care provider. Make sure you discuss any questions you have with your health care provider. Document Revised: 01/03/2023 Document Reviewed: 05/10/2022 Elsevier Patient Education  2024 Elsevier Inc.    If you have been instructed to have an in-person evaluation today at a local Urgent Care facility, please use the link below. It will take you to a list of all of our available [facility], including address, phone number and hours of operation. Please do not delay care.  [facility]  If you or a family member do not have a primary care provider, use the link below to schedule a visit and establish care. When you choose a [facility] primary care physician or advanced practice provider, you gain a long-term partner in health. Find a Primary Care Provider  Learn more about [facility]'s in-office and virtual care options: [facility] - Get Care Now

## 2023-05-26 ENCOUNTER — Other Ambulatory Visit (HOSPITAL_BASED_OUTPATIENT_CLINIC_OR_DEPARTMENT_OTHER): Payer: Self-pay | Admitting: Family Medicine

## 2023-05-26 DIAGNOSIS — F902 Attention-deficit hyperactivity disorder, combined type: Secondary | ICD-10-CM

## 2023-05-27 ENCOUNTER — Other Ambulatory Visit: Payer: Self-pay

## 2023-05-29 ENCOUNTER — Other Ambulatory Visit (HOSPITAL_BASED_OUTPATIENT_CLINIC_OR_DEPARTMENT_OTHER): Payer: Self-pay

## 2023-05-29 MED ORDER — LISDEXAMFETAMINE DIMESYLATE 70 MG PO CAPS
70.0000 mg | ORAL_CAPSULE | ORAL | 0 refills | Status: DC
  Filled 2023-05-29: qty 30, 30d supply, fill #0

## 2023-06-03 ENCOUNTER — Other Ambulatory Visit (HOSPITAL_BASED_OUTPATIENT_CLINIC_OR_DEPARTMENT_OTHER): Payer: Self-pay | Admitting: Family Medicine

## 2023-06-03 ENCOUNTER — Encounter (HOSPITAL_BASED_OUTPATIENT_CLINIC_OR_DEPARTMENT_OTHER): Payer: Self-pay | Admitting: Family Medicine

## 2023-06-03 DIAGNOSIS — S62650A Nondisplaced fracture of medial phalanx of right index finger, initial encounter for closed fracture: Secondary | ICD-10-CM

## 2023-06-20 ENCOUNTER — Encounter (HOSPITAL_BASED_OUTPATIENT_CLINIC_OR_DEPARTMENT_OTHER): Payer: Self-pay | Admitting: *Deleted

## 2023-06-25 ENCOUNTER — Other Ambulatory Visit (HOSPITAL_BASED_OUTPATIENT_CLINIC_OR_DEPARTMENT_OTHER): Payer: Self-pay

## 2023-06-30 ENCOUNTER — Other Ambulatory Visit (HOSPITAL_BASED_OUTPATIENT_CLINIC_OR_DEPARTMENT_OTHER): Payer: Self-pay | Admitting: Family Medicine

## 2023-06-30 DIAGNOSIS — F902 Attention-deficit hyperactivity disorder, combined type: Secondary | ICD-10-CM

## 2023-07-01 MED ORDER — OMEPRAZOLE 20 MG PO CPDR: 20.0000 mg | DELAYED_RELEASE_CAPSULE | Freq: Every day | ORAL | 1 refills | Status: DC

## 2023-07-02 ENCOUNTER — Other Ambulatory Visit (HOSPITAL_BASED_OUTPATIENT_CLINIC_OR_DEPARTMENT_OTHER): Payer: Self-pay

## 2023-07-02 MED ORDER — LISDEXAMFETAMINE DIMESYLATE 70 MG PO CAPS: 70.0000 mg | ORAL_CAPSULE | ORAL | 0 refills | Status: DC

## 2023-07-02 NOTE — Telephone Encounter (Signed)
 Patient stated his finger felt fine now and he had no reason to go to orthopedic surgeon

## 2023-07-04 ENCOUNTER — Other Ambulatory Visit (HOSPITAL_BASED_OUTPATIENT_CLINIC_OR_DEPARTMENT_OTHER): Payer: Self-pay | Admitting: Family Medicine

## 2023-07-04 ENCOUNTER — Other Ambulatory Visit (HOSPITAL_BASED_OUTPATIENT_CLINIC_OR_DEPARTMENT_OTHER): Payer: Self-pay

## 2023-07-04 ENCOUNTER — Telehealth (HOSPITAL_BASED_OUTPATIENT_CLINIC_OR_DEPARTMENT_OTHER): Payer: Self-pay | Admitting: *Deleted

## 2023-07-04 ENCOUNTER — Encounter (HOSPITAL_BASED_OUTPATIENT_CLINIC_OR_DEPARTMENT_OTHER): Payer: Self-pay | Admitting: Family Medicine

## 2023-07-04 DIAGNOSIS — F902 Attention-deficit hyperactivity disorder, combined type: Secondary | ICD-10-CM

## 2023-07-04 MED ORDER — OMEPRAZOLE 20 MG PO CPDR
20.0000 mg | DELAYED_RELEASE_CAPSULE | Freq: Every day | ORAL | 1 refills | Status: DC
  Filled 2023-07-04: qty 30, 30d supply, fill #0
  Filled 2023-07-28: qty 30, 30d supply, fill #1

## 2023-07-04 NOTE — Telephone Encounter (Signed)
 Copied from CRM [id number]. Topic: Clinical - Prescription Issue >> Jul 04, 2023  3:22 PM Fuller Mandril wrote: Reason for CRM: Patient father called- states medication was originally requested to go to the incorrect pharmacy. They did not pick it up - They denied it at pharmacy. Would like sent to [facility]. Submitted request via mychart to have changed to [place] and it was denied. Calling to make sure it can be changed. Only has enough left for one day. I did let him know that the request had been sent to provider for approval. Thank You

## 2023-07-04 NOTE — Telephone Encounter (Signed)
 Did send you another message seperately

## 2023-07-05 ENCOUNTER — Other Ambulatory Visit (HOSPITAL_BASED_OUTPATIENT_CLINIC_OR_DEPARTMENT_OTHER): Payer: Self-pay

## 2023-07-05 ENCOUNTER — Encounter (HOSPITAL_BASED_OUTPATIENT_CLINIC_OR_DEPARTMENT_OTHER): Payer: Self-pay | Admitting: *Deleted

## 2023-07-05 MED ORDER — LISDEXAMFETAMINE DIMESYLATE 70 MG PO CAPS
70.0000 mg | ORAL_CAPSULE | ORAL | 0 refills | Status: DC
  Filled 2023-07-05: qty 30, 30d supply, fill #0

## 2023-07-05 NOTE — Addendum Note (Signed)
 Addended by: DE Peru, Vani Gunner J on: 07/05/2023 08:16 AM   Modules accepted: Orders

## 2023-07-23 ENCOUNTER — Encounter (HOSPITAL_BASED_OUTPATIENT_CLINIC_OR_DEPARTMENT_OTHER): Payer: Self-pay | Admitting: Family Medicine

## 2023-07-23 ENCOUNTER — Ambulatory Visit (HOSPITAL_BASED_OUTPATIENT_CLINIC_OR_DEPARTMENT_OTHER): Payer: 59 | Admitting: Family Medicine

## 2023-07-23 VITALS — BP 126/62 | HR 92 | Ht 73.0 in | Wt 159.8 lb

## 2023-07-23 DIAGNOSIS — F902 Attention-deficit hyperactivity disorder, combined type: Secondary | ICD-10-CM

## 2023-07-23 DIAGNOSIS — H938X9 Other specified disorders of ear, unspecified ear: Secondary | ICD-10-CM | POA: Diagnosis not present

## 2023-07-23 DIAGNOSIS — S6991XD Unspecified injury of right wrist, hand and finger(s), subsequent encounter: Secondary | ICD-10-CM | POA: Diagnosis not present

## 2023-07-23 NOTE — Progress Notes (Unsigned)
    Procedures performed today:    None.  Independent interpretation of notes and tests performed by another provider:   None.  Brief History, Exam, Impression, and Recommendations:    BP 126/62 (BP Location: Right Arm, Patient Position: Sitting, Cuff Size: Normal)   Pulse 92   Ht 6\' 1"  (1.854 m)   Wt 159 lb 12.8 oz (72.5 kg)   SpO2 97%   BMI 21.08 kg/m   There are no diagnoses linked to this encounter.No follow-ups on file.   ___________________________________________ Delaine Hernandez de [place], MD, ABFM, [facility] Primary Care and Sports Medicine [facility]

## 2023-07-23 NOTE — Patient Instructions (Signed)
 ?  Medication Instructions:  ?Your physician recommends that you continue on your current medications as directed. Please refer to the Current Medication list given to you today. ?--If you need a refill on any your medications before your next appointment, please call your pharmacy first. If no refills are authorized on file call the office.-- ? ? ? ?Follow-Up: ?Your next appointment:   ?Your physician recommends that you schedule a follow-up appointment in: 3 month follow-up with Dr. de Peru ? ?You will receive a text message or e-mail with a link to a survey about your care and experience with [place] today! We would greatly appreciate your feedback!  ? ?Thanks for letting us be apart of your health journey!!  ?Primary Care and Sports Medicine  ? ?Dr. Marcy Salvo de [place]  ? ?We encourage you to activate your patient portal called "MyChart".  Sign up information is provided on this After Visit Summary.  MyChart is used to connect with patients for Virtual Visits (Telemedicine).  Patients are able to view lab/test results, encounter notes, upcoming appointments, etc.  Non-urgent messages can be sent to your provider as well. To learn more about what you can do with MyChart, please visit --  [email or web address].    ?

## 2023-07-24 DIAGNOSIS — H938X9 Other specified disorders of ear, unspecified ear: Secondary | ICD-10-CM | POA: Insufficient documentation

## 2023-07-24 NOTE — Assessment & Plan Note (Signed)
 X-rays completed previously did show nondisplaced volar plate fracture.  We reviewed imaging in the office today.  He indicates that he has been doing well.  Does not have any pain at this time.  Notes full range of motion and function.  Denies any concerns.  We did place referral to orthopedic surgeons office, however patient did not schedule appointment.  We discussed today and patient would like to monitor progress moving forward and declines any evaluation with orthopedic surgery at this time.

## 2023-07-24 NOTE — Assessment & Plan Note (Signed)
 Patient reports that he has noticed a couple small slightly tender nodules in right earlobe.  These have been present for couple days.  He reports that he has had similar episodes in the past which have typically resolved on their own.  Has not noticed any other symptoms such as jaw pain or specific ear pain or changes in hearing or ringing in the ears.  No recent symptoms of illness such as fever, cough, sinus congestion. On exam, small nodules palpated within earlobe, mildly tender.  No fluctuance.  No overlying skin changes such as erythema. Suspect that these could be related to possible pimples within the ear lobe given recurrent issue with these in the past and similar presentation with current nodules.  For now, recommend monitoring this area and if not resolving as expected which has occurred with prior nodules, can return for further evaluation.

## 2023-07-24 NOTE — Assessment & Plan Note (Signed)
 Patient doing well at present with use of Vyvanse and guanfacine.  He does not need Vyvanse refilled at this time.  Denies any issues with sleep concerns, palpitations, appetite concerns.  PDMP reviewed, no red flags. Can continue with current regimen. Will sen refill when next needed.

## 2023-07-28 ENCOUNTER — Other Ambulatory Visit (HOSPITAL_BASED_OUTPATIENT_CLINIC_OR_DEPARTMENT_OTHER): Payer: Self-pay | Admitting: Family Medicine

## 2023-07-28 DIAGNOSIS — F902 Attention-deficit hyperactivity disorder, combined type: Secondary | ICD-10-CM

## 2023-07-29 ENCOUNTER — Other Ambulatory Visit: Payer: Self-pay

## 2023-07-29 ENCOUNTER — Other Ambulatory Visit (HOSPITAL_BASED_OUTPATIENT_CLINIC_OR_DEPARTMENT_OTHER): Payer: Self-pay

## 2023-07-29 MED ORDER — LISDEXAMFETAMINE DIMESYLATE 70 MG PO CAPS
70.0000 mg | ORAL_CAPSULE | ORAL | 0 refills | Status: DC
  Filled 2023-07-29 – 2023-08-06 (×2): qty 30, 30d supply, fill #0

## 2023-07-29 MED ORDER — GUANFACINE HCL ER 3 MG PO TB24
3.0000 mg | ORAL_TABLET | Freq: Every day | ORAL | 1 refills | Status: DC
  Filled 2023-07-29: qty 30, 30d supply, fill #0
  Filled 2023-08-06 – 2023-08-30 (×2): qty 30, 30d supply, fill #1
  Filled 2023-09-29: qty 30, 30d supply, fill #2
  Filled 2023-10-07: qty 30, 30d supply, fill #3

## 2023-08-06 ENCOUNTER — Other Ambulatory Visit (HOSPITAL_BASED_OUTPATIENT_CLINIC_OR_DEPARTMENT_OTHER): Payer: Self-pay

## 2023-08-06 ENCOUNTER — Other Ambulatory Visit (HOSPITAL_BASED_OUTPATIENT_CLINIC_OR_DEPARTMENT_OTHER): Payer: Self-pay | Admitting: Family Medicine

## 2023-08-06 MED ORDER — OMEPRAZOLE 20 MG PO CPDR
20.0000 mg | DELAYED_RELEASE_CAPSULE | Freq: Every day | ORAL | 1 refills | Status: DC
  Filled 2023-08-06 – 2023-08-30 (×2): qty 30, 30d supply, fill #0
  Filled 2023-09-29: qty 30, 30d supply, fill #1

## 2023-08-30 ENCOUNTER — Other Ambulatory Visit (HOSPITAL_BASED_OUTPATIENT_CLINIC_OR_DEPARTMENT_OTHER): Payer: Self-pay | Admitting: Family Medicine

## 2023-08-30 ENCOUNTER — Other Ambulatory Visit (HOSPITAL_BASED_OUTPATIENT_CLINIC_OR_DEPARTMENT_OTHER): Payer: Self-pay

## 2023-08-30 ENCOUNTER — Other Ambulatory Visit: Payer: Self-pay

## 2023-08-30 DIAGNOSIS — F902 Attention-deficit hyperactivity disorder, combined type: Secondary | ICD-10-CM

## 2023-09-02 ENCOUNTER — Other Ambulatory Visit (HOSPITAL_BASED_OUTPATIENT_CLINIC_OR_DEPARTMENT_OTHER): Payer: Self-pay

## 2023-09-02 MED ORDER — LISDEXAMFETAMINE DIMESYLATE 70 MG PO CAPS
70.0000 mg | ORAL_CAPSULE | ORAL | 0 refills | Status: DC
  Filled 2023-09-02 – 2023-09-05 (×2): qty 30, 30d supply, fill #0

## 2023-09-05 ENCOUNTER — Other Ambulatory Visit (HOSPITAL_BASED_OUTPATIENT_CLINIC_OR_DEPARTMENT_OTHER): Payer: Self-pay

## 2023-09-29 ENCOUNTER — Other Ambulatory Visit (HOSPITAL_BASED_OUTPATIENT_CLINIC_OR_DEPARTMENT_OTHER): Payer: Self-pay | Admitting: Family Medicine

## 2023-09-29 DIAGNOSIS — F902 Attention-deficit hyperactivity disorder, combined type: Secondary | ICD-10-CM

## 2023-09-30 ENCOUNTER — Other Ambulatory Visit (HOSPITAL_BASED_OUTPATIENT_CLINIC_OR_DEPARTMENT_OTHER): Payer: Self-pay

## 2023-09-30 ENCOUNTER — Other Ambulatory Visit: Payer: Self-pay

## 2023-09-30 MED ORDER — LISDEXAMFETAMINE DIMESYLATE 70 MG PO CAPS
70.0000 mg | ORAL_CAPSULE | ORAL | 0 refills | Status: DC
  Filled 2023-09-30 – 2023-10-07 (×2): qty 30, 30d supply, fill #0

## 2023-10-07 ENCOUNTER — Other Ambulatory Visit: Payer: Self-pay

## 2023-10-07 ENCOUNTER — Other Ambulatory Visit (HOSPITAL_BASED_OUTPATIENT_CLINIC_OR_DEPARTMENT_OTHER): Payer: Self-pay | Admitting: Family Medicine

## 2023-10-07 ENCOUNTER — Other Ambulatory Visit (HOSPITAL_BASED_OUTPATIENT_CLINIC_OR_DEPARTMENT_OTHER): Payer: Self-pay

## 2023-10-07 MED ORDER — OMEPRAZOLE 20 MG PO CPDR
20.0000 mg | DELAYED_RELEASE_CAPSULE | Freq: Every day | ORAL | 1 refills | Status: DC
  Filled 2023-10-07 – 2023-11-03 (×2): qty 30, 30d supply, fill #0
  Filled 2023-12-01: qty 30, 30d supply, fill #1

## 2023-10-07 MED ORDER — FLUOXETINE HCL 20 MG PO CAPS
20.0000 mg | ORAL_CAPSULE | Freq: Every day | ORAL | 1 refills | Status: DC
  Filled 2023-10-07: qty 90, 90d supply, fill #0
  Filled 2023-11-03: qty 30, 30d supply, fill #0
  Filled 2023-12-01: qty 30, 30d supply, fill #1
  Filled 2024-01-05: qty 30, 30d supply, fill #2
  Filled 2024-01-31: qty 30, 30d supply, fill #3
  Filled 2024-03-05: qty 30, 30d supply, fill #4
  Filled 2024-04-01: qty 30, 30d supply, fill #5

## 2023-10-22 ENCOUNTER — Encounter (HOSPITAL_BASED_OUTPATIENT_CLINIC_OR_DEPARTMENT_OTHER): Payer: Self-pay | Admitting: Family Medicine

## 2023-10-22 ENCOUNTER — Ambulatory Visit (INDEPENDENT_AMBULATORY_CARE_PROVIDER_SITE_OTHER): Admitting: Family Medicine

## 2023-10-22 ENCOUNTER — Other Ambulatory Visit (HOSPITAL_BASED_OUTPATIENT_CLINIC_OR_DEPARTMENT_OTHER): Payer: Self-pay

## 2023-10-22 VITALS — BP 106/64 | HR 67 | Ht 73.0 in | Wt 161.6 lb

## 2023-10-22 DIAGNOSIS — Z0000 Encounter for general adult medical examination without abnormal findings: Secondary | ICD-10-CM | POA: Insufficient documentation

## 2023-10-22 DIAGNOSIS — Z13220 Encounter for screening for lipoid disorders: Secondary | ICD-10-CM

## 2023-10-22 MED ORDER — GUANFACINE HCL ER 2 MG PO TB24
2.0000 mg | ORAL_TABLET | Freq: Every day | ORAL | 1 refills | Status: DC
  Filled 2023-10-22 – 2023-11-03 (×2): qty 30, 30d supply, fill #0
  Filled 2023-12-01: qty 30, 30d supply, fill #1

## 2023-10-22 NOTE — Patient Instructions (Signed)
  Medication Instructions:  Your physician recommends that you continue on your current medications as directed. Please refer to the Current Medication list given to you today. --If you need a refill on any your medications before your next appointment, please call your pharmacy first. If no refills are authorized on file call the office.-- Lab Work: Your physician has recommended that you have lab work today: fasting labs when able If you have labs (blood work) drawn today and your tests are completely normal, you will receive your results via MyChart message OR a phone call from our staff.  Please ensure you check your voicemail in the event that you authorized detailed messages to be left on a delegated number. If you have any lab test that is abnormal or we need to change your treatment, we will call you to review the results.   Follow-Up: Your next appointment:   Your physician recommends that you schedule a follow-up appointment in: 1 month (can be virtual) with Dr. de Peru  You will receive a text message or e-mail with a link to a survey about your care and experience with us  today! We would greatly appreciate your feedback!   Thanks for letting us  be apart of your health journey!!  Primary Care and Sports Medicine   Dr. Quintin sheerer Peru   We encourage you to activate your patient portal called MyChart.  Sign up information is provided on this After Visit Summary.  MyChart is used to connect with patients for Virtual Visits (Telemedicine).  Patients are able to view lab/test results, encounter notes, upcoming appointments, etc.  Non-urgent messages can be sent to your provider as well. To learn more about what you can do with MyChart, please visit --  [email or web address].

## 2023-10-22 NOTE — Assessment & Plan Note (Signed)

## 2023-10-22 NOTE — Progress Notes (Signed)
 Subjective:    CC: Annual Physical Exam  HPI:  Joshua Ashley is a 23 y.o. presenting for annual physical  I reviewed the past medical history, family history, social history, surgical history, and allergies today and no changes were needed.  Please see the problem list section below in epic for further details.  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 No pertinent surgical history. Social History: Social History   Socioeconomic History   Marital status: Single    Spouse name: Not on file   Number of children: Not on file   Years of education: Not on file   Highest education level: 12th grade  Occupational History   Not on file  Tobacco Use   Smoking status: Never    Passive exposure: Never   Smokeless tobacco: Never  Substance and Sexual Activity   Alcohol use: Not Currently   Drug use: Not Currently   Sexual activity: Not Currently  Other Topics Concern   Not on file  Social History Narrative   Not on file   Social Drivers of Health   Financial Resource Strain: Low Risk  (10/21/2023)   Overall Financial Resource Strain (CARDIA)    Difficulty of Paying Living Expenses: Not hard at all  Food Insecurity: No Food Insecurity (10/21/2023)   Hunger Vital Sign    Worried About Running Out of Food in the Last Year: Never true    Ran Out of Food in the Last Year: Never true  Transportation Needs: No Transportation Needs (10/21/2023)   PRAPARE - Administrator, Civil Service (Medical): No    Lack of Transportation (Non-Medical): No  Physical Activity: Sufficiently Active (10/21/2023)   Exercise Vital Sign    Days of Exercise per Week: 5 days    Minutes of Exercise per Session: 60 min  Stress: No Stress Concern Present (10/21/2023)    [employer] of Occupational Health - Occupational Stress Questionnaire    Feeling of Stress: Not at all  Social Connections: Unknown (10/21/2023)   Social Connection and Isolation Panel    Frequency of Communication with Friends and Family: More than three times a week    Frequency of Social Gatherings with Friends and Family: More than three times a week    Attends Religious Services: Never    Database administrator or Organizations: No    Attends Engineer, structural: Not on file    Marital Status: Patient declined   Family History: History reviewed. No pertinent family history. Allergies: Allergies  Allergen Reactions   Carrot Flavor [Flavoring Agent (Non-Screening)] Hives   Pear Hives   Medications: See med rec.  Review of Systems: No headache, visual changes, nausea, vomiting, diarrhea, constipation, dizziness, abdominal pain, skin rash, fevers, chills, night sweats, swollen lymph nodes, weight loss, chest pain, body aches, joint swelling, muscle aches, shortness of breath, mood changes, visual or auditory hallucinations.  Objective:    BP 106/64 (BP Location: Right Arm, Patient Position: Sitting, Cuff Size: Normal)   Pulse 67   Ht 6' 1 (1.854 m)   Wt 161 lb 9.6 oz (73.3 kg)   SpO2 97%   BMI 21.32 kg/m   General: Well Developed, well nourished, and in no acute distress. Neuro: Alert and oriented x3, extra-ocular muscles intact, sensation grossly intact.  Cranial nerves II through XII are intact, motor, sensory, and coordinative functions are all intact. HEENT: Normocephalic, atraumatic, pupils equal round reactive to light, neck supple, no masses, no lymphadenopathy, thyroid  nonpalpable. Oropharynx, nasopharynx, external ear canals are unremarkable. Skin: Warm and dry, no rashes noted. Cardiac: Regular rate and rhythm, no murmurs rubs or gallops. Respiratory: Clear to auscultation bilaterally. Not using accessory muscles, speaking in full sentences. Abdominal:  Soft, nontender, nondistended, positive bowel sounds, no masses, no organomegaly. Musculoskeletal: Shoulder, elbow, wrist, hip, knee, ankle stable, and with full range of motion.  Impression and Recommendations:    Wellness examination Assessment & Plan: Routine HCM labs ordered. HCM reviewed/discussed. Anticipatory guidance regarding healthy weight, lifestyle and choices given. Recommend healthy diet.  Recommend approximately 150 minutes/week of moderate intensity exercise Recommend regular dental and vision exams Always use seatbelt/lap and shoulder restraints Recommend using smoke alarms and checking batteries at least twice a year Recommend using sunscreen when outside Discussed immunization recommendations  Orders: -     CBC with Differential/Platelet; Future -     Comprehensive metabolic panel with GFR; Future -     Lipid panel; Future  Screening for lipid disorders -     Lipid panel; Future  Other orders -     guanFACINE  HCl ER; Take 1 tablet (2 mg total) by mouth daily.  Dispense: 30 tablet; Refill: 1  Patient has interested in gradually reducing current medication regimen.  We discussed considerations in this regard and we can look to gradually reduce dose of guanfacine  initially and pending progress, can look to overtime discontinue this medication prior to then looking to make adjustments with fluoxetine  and Vyvanse .  Will follow-up in about 1 month to assess progress with dose change  Return in about 1 month (around 11/22/2023) for med check, can be virtual.   ___________________________________________ Biran Mayberry de [place], MD, ABFM, [facility] Primary Care and Sports Medicine [facility]

## 2023-11-01 ENCOUNTER — Other Ambulatory Visit (HOSPITAL_BASED_OUTPATIENT_CLINIC_OR_DEPARTMENT_OTHER): Payer: Self-pay

## 2023-11-03 ENCOUNTER — Other Ambulatory Visit (HOSPITAL_BASED_OUTPATIENT_CLINIC_OR_DEPARTMENT_OTHER): Payer: Self-pay | Admitting: Family Medicine

## 2023-11-03 DIAGNOSIS — F902 Attention-deficit hyperactivity disorder, combined type: Secondary | ICD-10-CM

## 2023-11-04 ENCOUNTER — Other Ambulatory Visit: Payer: Self-pay

## 2023-11-04 ENCOUNTER — Other Ambulatory Visit (HOSPITAL_BASED_OUTPATIENT_CLINIC_OR_DEPARTMENT_OTHER): Payer: Self-pay

## 2023-11-04 MED ORDER — LISDEXAMFETAMINE DIMESYLATE 70 MG PO CAPS
70.0000 mg | ORAL_CAPSULE | ORAL | 0 refills | Status: DC
  Filled 2023-11-04: qty 30, 30d supply, fill #0

## 2023-11-04 NOTE — Telephone Encounter (Signed)
 De peru pt. Joshua Ashley, please advise if you are okay refilling med.

## 2023-11-05 ENCOUNTER — Other Ambulatory Visit (HOSPITAL_BASED_OUTPATIENT_CLINIC_OR_DEPARTMENT_OTHER): Payer: Self-pay

## 2023-11-14 ENCOUNTER — Ambulatory Visit: Payer: Self-pay | Admitting: *Deleted

## 2023-11-14 NOTE — Telephone Encounter (Signed)
 Copied from CRM #[id number]. Topic: Clinical - Red Word Triage >> Nov 14, 2023 10:15 AM Tiffini S wrote: [facility] that prompted transfer to Nurse Triage: Patient called stating that the  left hand on ring finger is almost completely blue- there is blood under the finger nail. Started yesterday when working out. Took Tylenol last night and this morning. Transferred call to triage nurse. Reason for Disposition  [1] MODERATE-SEVERE pain AND [2] blood present under a nail  Answer Assessment - Initial Assessment Questions 1. MECHANISM: How did the injury happen?      Yesterday I was in the gym working out.   I helped him get the rack onto the bar and it crushed my left ring finger.   I have a hematoma under my fingernail.     2. ONSET: When did the injury happen? (e.g., minutes, hours ago)      Yesterday  3. LOCATION: What part of the finger is injured? Is the nail damaged?      Left hand ring finger. 4. APPEARANCE of the INJURY: What does the injury look like?      Hematoma under my fingernail.     5. SEVERITY: Can you use the hand normally?  Can you bend your fingers into a ball and then fully open them?     It's hurting a lot 6. SIZE: For cuts, bruises, or swelling, ask: How large is it? (e.g., inches or centimeters;  entire finger)      See above 7. PAIN: Is there pain? If Yes, ask: How bad is the pain?  (Scale 0-10; or none, mild, moderate, severe)     Yes 8. TETANUS: For any breaks in the skin, ask: When was your last tetanus booster?     N/A 9. OTHER SYMPTOMS: Do you have any other symptoms?     No 10. PREGNANCY: Is there any chance you are pregnant? When was your last menstrual period?       N/A  Protocols used: Finger Injury-A-AH FYI Only or Action Required?: FYI only for provider.  Patient was last seen in primary care on 10/22/2023 by de [place], Quintin PARAS, MD.  Called Nurse Triage reporting Finger Injury. Left ring finger crushed under a barbell.  Now has a  big hematoma under the fingernail that is very painful.     Symptoms began yesterday.  Interventions attempted: OTC medications: Tylenol.  Symptoms are: gradually worsening.  Triage Disposition: See HCP Within 4 Hours (Or PCP Triage) No appts with any of the providers.   Referred him to the ED which he is agreeable to going.   Requesting to have it drained reason referred to ED vs urgent care.    Patient/caregiver understands and will follow disposition?: Yes

## 2023-12-01 ENCOUNTER — Other Ambulatory Visit (HOSPITAL_BASED_OUTPATIENT_CLINIC_OR_DEPARTMENT_OTHER): Payer: Self-pay | Admitting: Family Medicine

## 2023-12-01 DIAGNOSIS — F902 Attention-deficit hyperactivity disorder, combined type: Secondary | ICD-10-CM

## 2023-12-02 ENCOUNTER — Other Ambulatory Visit (HOSPITAL_BASED_OUTPATIENT_CLINIC_OR_DEPARTMENT_OTHER): Payer: Self-pay

## 2023-12-02 ENCOUNTER — Other Ambulatory Visit: Payer: Self-pay

## 2023-12-02 MED ORDER — LISDEXAMFETAMINE DIMESYLATE 70 MG PO CAPS
70.0000 mg | ORAL_CAPSULE | ORAL | 0 refills | Status: DC
  Filled 2023-12-02 – 2023-12-07 (×2): qty 30, 30d supply, fill #0

## 2023-12-03 ENCOUNTER — Telehealth (HOSPITAL_BASED_OUTPATIENT_CLINIC_OR_DEPARTMENT_OTHER): Payer: Self-pay | Admitting: Family Medicine

## 2023-12-03 ENCOUNTER — Ambulatory Visit (HOSPITAL_BASED_OUTPATIENT_CLINIC_OR_DEPARTMENT_OTHER): Admitting: Family Medicine

## 2023-12-03 NOTE — Telephone Encounter (Signed)
 Patient came late for appointment and we were able to reschedule to 9/17 he wanted to let you know the meds were going good and he will discuss the possible reduction of some at the follow up. Thank you.

## 2023-12-07 ENCOUNTER — Other Ambulatory Visit (HOSPITAL_BASED_OUTPATIENT_CLINIC_OR_DEPARTMENT_OTHER): Payer: Self-pay

## 2023-12-09 ENCOUNTER — Encounter (HOSPITAL_BASED_OUTPATIENT_CLINIC_OR_DEPARTMENT_OTHER): Payer: Self-pay | Admitting: Family Medicine

## 2024-01-01 ENCOUNTER — Telehealth (HOSPITAL_BASED_OUTPATIENT_CLINIC_OR_DEPARTMENT_OTHER): Admitting: Family Medicine

## 2024-01-01 ENCOUNTER — Encounter (HOSPITAL_BASED_OUTPATIENT_CLINIC_OR_DEPARTMENT_OTHER): Payer: Self-pay | Admitting: Family Medicine

## 2024-01-01 ENCOUNTER — Other Ambulatory Visit (HOSPITAL_BASED_OUTPATIENT_CLINIC_OR_DEPARTMENT_OTHER): Payer: Self-pay

## 2024-01-01 DIAGNOSIS — F902 Attention-deficit hyperactivity disorder, combined type: Secondary | ICD-10-CM

## 2024-01-01 MED ORDER — LISDEXAMFETAMINE DIMESYLATE 70 MG PO CAPS
70.0000 mg | ORAL_CAPSULE | ORAL | 0 refills | Status: DC
  Filled 2024-01-01 – 2024-01-05 (×2): qty 30, 30d supply, fill #0

## 2024-01-01 MED ORDER — GUANFACINE HCL ER 1 MG PO TB24
1.0000 mg | ORAL_TABLET | Freq: Every day | ORAL | 2 refills | Status: DC
  Filled 2024-01-01: qty 30, 30d supply, fill #0
  Filled 2024-01-31: qty 30, 30d supply, fill #1
  Filled 2024-03-05: qty 30, 30d supply, fill #2

## 2024-01-01 NOTE — Assessment & Plan Note (Signed)
 Patient doing well at present with use of Vyvanse  and guanfacine .  He does need Vyvanse  refilled at this time.  Denies any issues with sleep concerns, palpitations, appetite concerns.  PDMP reviewed, no red flags. At last visit, we did look to taper dose of guanfacine  with plans to gradually discontinue.  He reports that he has been doing well with dose change, no concerns reported at this time.  He would like to continue with gradual tapering of dose with plan for eventual discontinuation.  Lower dose of guanfacine  sent to pharmacy. Can continue with current regimen.  Will plan for follow-up in about 2 to 3 months to monitor progress or sooner as needed

## 2024-01-01 NOTE — Progress Notes (Signed)
   Virtual Visit   I connected with  Joshua Ashley  on 01/01/24 by telehealth and verified that I am speaking with the correct person using two identifiers. Visit completed via video.   I discussed the limitations, risks, security and privacy concerns of performing an evaluation and management service by telephone, including the higher likelihood of inaccurate diagnosis and treatment, and the availability of in person appointments.  We also discussed the likely need of an additional face to face encounter for complete and high quality delivery of care.  I also discussed with the patient that there may be a patient responsible charge related to this service. The patient expressed understanding and wishes to proceed.  Provider location is in medical facility. Patient location is at their home, different from provider location. People involved in care of the patient during this telehealth encounter were myself, my nurse/medical assistant, and my front office/scheduling team member.  Review of Systems: No fevers, chills, night sweats, weight loss, chest pain, or shortness of breath.   Objective Findings:    General: Speaking full sentences, no audible heavy breathing.  Sounds alert and appropriately interactive.    Independent interpretation of tests performed by another provider:   None.  Brief History, Exam, Impression, and Recommendations:    ADHD (attention deficit hyperactivity disorder), combined type Patient doing well at present with use of Vyvanse  and guanfacine .  He does need Vyvanse  refilled at this time.  Denies any issues with sleep concerns, palpitations, appetite concerns.  PDMP reviewed, no red flags. At last visit, we did look to taper dose of guanfacine  with plans to gradually discontinue.  He reports that he has been doing well with dose change, no concerns reported at this time.  He would like to continue with gradual tapering of dose with plan for eventual  discontinuation.  Lower dose of guanfacine  sent to pharmacy. Can continue with current regimen.  Will plan for follow-up in about 2 to 3 months to monitor progress or sooner as needed  Patient reports recent illness last week.  Generally symptoms are improved, but still has some decreased hearing out of right ear.  Denies any significant pain.  Has not had any fever, chills, sweats, no trouble breathing or shortness of breath. Patient will come by the office tomorrow for us  to take a look in his right ear.  Depending on what is observed, we will discuss further management recommendations.  I discussed the above assessment and treatment plan with the patient. The patient was provided an opportunity to ask questions and all were answered. The patient agreed with the plan and demonstrated an understanding of the instructions.  The patient was advised to call back or seek an in-person evaluation if the symptoms worsen or if the condition fails to improve as anticipated.  I provided 12 minutes of face to face and non-face-to-face time during this encounter date, time was needed to gather information, review chart, records, communicate/coordinate with staff remotely, as well as complete documentation.   ___________________________________________ Devra Stare de [place], MD, ABFM, CAQSM Primary Care and Sports Medicine [facility]

## 2024-01-05 ENCOUNTER — Other Ambulatory Visit (HOSPITAL_BASED_OUTPATIENT_CLINIC_OR_DEPARTMENT_OTHER): Payer: Self-pay | Admitting: Family Medicine

## 2024-01-06 ENCOUNTER — Other Ambulatory Visit: Payer: Self-pay

## 2024-01-06 ENCOUNTER — Other Ambulatory Visit (HOSPITAL_BASED_OUTPATIENT_CLINIC_OR_DEPARTMENT_OTHER): Payer: Self-pay

## 2024-01-06 MED ORDER — OMEPRAZOLE 20 MG PO CPDR
20.0000 mg | DELAYED_RELEASE_CAPSULE | Freq: Every day | ORAL | 1 refills | Status: DC
  Filled 2024-01-06: qty 30, 30d supply, fill #0
  Filled 2024-01-31: qty 30, 30d supply, fill #1

## 2024-01-31 ENCOUNTER — Other Ambulatory Visit: Payer: Self-pay

## 2024-01-31 ENCOUNTER — Other Ambulatory Visit (HOSPITAL_BASED_OUTPATIENT_CLINIC_OR_DEPARTMENT_OTHER): Payer: Self-pay

## 2024-01-31 ENCOUNTER — Other Ambulatory Visit (HOSPITAL_BASED_OUTPATIENT_CLINIC_OR_DEPARTMENT_OTHER): Payer: Self-pay | Admitting: Family Medicine

## 2024-01-31 DIAGNOSIS — F902 Attention-deficit hyperactivity disorder, combined type: Secondary | ICD-10-CM

## 2024-01-31 MED ORDER — LISDEXAMFETAMINE DIMESYLATE 70 MG PO CAPS
70.0000 mg | ORAL_CAPSULE | ORAL | 0 refills | Status: DC
  Filled 2024-01-31 – 2024-02-07 (×2): qty 30, 30d supply, fill #0

## 2024-01-31 NOTE — Telephone Encounter (Signed)
 Please advise if you are okay refilling pt's med with PCP being out of the office today 10/17.

## 2024-02-07 ENCOUNTER — Other Ambulatory Visit (HOSPITAL_BASED_OUTPATIENT_CLINIC_OR_DEPARTMENT_OTHER): Payer: Self-pay

## 2024-03-05 ENCOUNTER — Other Ambulatory Visit (HOSPITAL_BASED_OUTPATIENT_CLINIC_OR_DEPARTMENT_OTHER): Payer: Self-pay | Admitting: Family Medicine

## 2024-03-05 ENCOUNTER — Other Ambulatory Visit (HOSPITAL_BASED_OUTPATIENT_CLINIC_OR_DEPARTMENT_OTHER): Payer: Self-pay

## 2024-03-05 ENCOUNTER — Encounter (HOSPITAL_BASED_OUTPATIENT_CLINIC_OR_DEPARTMENT_OTHER): Payer: Self-pay

## 2024-03-05 DIAGNOSIS — F902 Attention-deficit hyperactivity disorder, combined type: Secondary | ICD-10-CM

## 2024-03-05 MED ORDER — OMEPRAZOLE 20 MG PO CPDR
20.0000 mg | DELAYED_RELEASE_CAPSULE | Freq: Every day | ORAL | 1 refills | Status: DC
  Filled 2024-03-05 (×2): qty 30, 30d supply, fill #0
  Filled 2024-04-01: qty 30, 30d supply, fill #1

## 2024-03-05 MED ORDER — LISDEXAMFETAMINE DIMESYLATE 70 MG PO CAPS
70.0000 mg | ORAL_CAPSULE | ORAL | 0 refills | Status: DC
  Filled 2024-03-05 – 2024-03-07 (×2): qty 30, 30d supply, fill #0

## 2024-03-07 ENCOUNTER — Other Ambulatory Visit (HOSPITAL_BASED_OUTPATIENT_CLINIC_OR_DEPARTMENT_OTHER): Payer: Self-pay

## 2024-03-31 ENCOUNTER — Other Ambulatory Visit (HOSPITAL_BASED_OUTPATIENT_CLINIC_OR_DEPARTMENT_OTHER): Payer: Self-pay

## 2024-03-31 MED ORDER — CHLORHEXIDINE GLUCONATE 0.12 % MT SOLN
OROMUCOSAL | 0 refills | Status: DC
  Filled 2024-03-31: qty 437, 16d supply, fill #0

## 2024-04-01 ENCOUNTER — Other Ambulatory Visit: Payer: Self-pay

## 2024-04-01 ENCOUNTER — Other Ambulatory Visit (HOSPITAL_BASED_OUTPATIENT_CLINIC_OR_DEPARTMENT_OTHER): Payer: Self-pay | Admitting: Family Medicine

## 2024-04-01 ENCOUNTER — Other Ambulatory Visit (HOSPITAL_BASED_OUTPATIENT_CLINIC_OR_DEPARTMENT_OTHER): Payer: Self-pay

## 2024-04-01 DIAGNOSIS — F902 Attention-deficit hyperactivity disorder, combined type: Secondary | ICD-10-CM

## 2024-04-01 MED ORDER — LISDEXAMFETAMINE DIMESYLATE 70 MG PO CAPS
70.0000 mg | ORAL_CAPSULE | ORAL | 0 refills | Status: DC
  Filled 2024-04-01: qty 30, 30d supply, fill #0
  Filled ????-??-??: fill #0

## 2024-04-01 MED ORDER — GUANFACINE HCL ER 1 MG PO TB24
1.0000 mg | ORAL_TABLET | Freq: Every day | ORAL | 2 refills | Status: AC
  Filled 2024-04-01: qty 30, 30d supply, fill #0
  Filled 2024-05-03: qty 30, 30d supply, fill #1

## 2024-04-02 ENCOUNTER — Other Ambulatory Visit: Payer: Self-pay

## 2024-04-04 ENCOUNTER — Other Ambulatory Visit (HOSPITAL_BASED_OUTPATIENT_CLINIC_OR_DEPARTMENT_OTHER): Payer: Self-pay

## 2024-05-03 ENCOUNTER — Other Ambulatory Visit (HOSPITAL_BASED_OUTPATIENT_CLINIC_OR_DEPARTMENT_OTHER): Payer: Self-pay | Admitting: Family Medicine

## 2024-05-03 DIAGNOSIS — F902 Attention-deficit hyperactivity disorder, combined type: Secondary | ICD-10-CM

## 2024-05-04 ENCOUNTER — Other Ambulatory Visit: Payer: Self-pay

## 2024-05-05 ENCOUNTER — Other Ambulatory Visit (HOSPITAL_BASED_OUTPATIENT_CLINIC_OR_DEPARTMENT_OTHER): Payer: Self-pay

## 2024-05-05 MED ORDER — LISDEXAMFETAMINE DIMESYLATE 70 MG PO CAPS
70.0000 mg | ORAL_CAPSULE | ORAL | 0 refills | Status: AC
  Filled 2024-05-05: qty 30, 30d supply, fill #0

## 2024-05-05 MED ORDER — FLUOXETINE HCL 20 MG PO CAPS
20.0000 mg | ORAL_CAPSULE | Freq: Every day | ORAL | 1 refills | Status: AC
  Filled 2024-05-05: qty 30, 30d supply, fill #0

## 2024-05-05 MED ORDER — OMEPRAZOLE 20 MG PO CPDR
20.0000 mg | DELAYED_RELEASE_CAPSULE | Freq: Every day | ORAL | 1 refills | Status: AC
  Filled 2024-05-05: qty 30, 30d supply, fill #0

## 2024-05-06 ENCOUNTER — Other Ambulatory Visit (HOSPITAL_BASED_OUTPATIENT_CLINIC_OR_DEPARTMENT_OTHER): Payer: Self-pay

## 2024-05-11 ENCOUNTER — Encounter (HOSPITAL_BASED_OUTPATIENT_CLINIC_OR_DEPARTMENT_OTHER): Payer: Self-pay | Admitting: *Deleted

## 2024-05-11 ENCOUNTER — Telehealth (INDEPENDENT_AMBULATORY_CARE_PROVIDER_SITE_OTHER): Admitting: Family Medicine

## 2024-05-11 VITALS — Ht 73.0 in | Wt 161.0 lb

## 2024-05-11 DIAGNOSIS — F902 Attention-deficit hyperactivity disorder, combined type: Secondary | ICD-10-CM | POA: Diagnosis not present

## 2024-05-11 NOTE — Progress Notes (Signed)
" ° °  Virtual Visit   I connected with  Joshua Ashley  on 05/11/24 by telephone and telehealth and verified that I am speaking with the correct person using two identifiers. Visit completed via video and telephone.  Greater than 50% of the visit was completed via audio only as patient was having difficulty with audio when trying to connect via telehealth/video.  I discussed the limitations, risks, security and privacy concerns of performing an evaluation and management service by telephone, including the higher likelihood of inaccurate diagnosis and treatment, and the availability of in person appointments.  We also discussed the likely need of an additional face to face encounter for complete and high quality delivery of care.  I also discussed with the patient that there may be a patient responsible charge related to this service. The patient expressed understanding and wishes to proceed.  Provider location is home. Patient location is at their home, different from provider location. People involved in care of the patient during this telehealth encounter were myself, my nurse/medical assistant, and my front office/scheduling team member.  Review of Systems: No fevers, chills, night sweats, weight loss, chest pain, or shortness of breath.  Objective Findings:    General: Speaking full sentences, no audible heavy breathing.  Sounds alert and appropriately interactive.  Independent interpretation of tests performed by another provider:   None.  Brief History, Exam, Impression, and Recommendations:    ADHD (attention deficit hyperactivity disorder), combined type Patient doing well at present with use of Vyvanse  and guanfacine .  He does not need Vyvanse  refilled at this time.  Denies any issues with sleep concerns, palpitations, appetite concerns.  PDMP reviewed, no red flags. Previously, we have tapered dose of guanfacine  with plans to gradually discontinue.  He reports that he has been  doing well with current dose, no concerns reported at this time.   We reviewed medication, he would like to consider lowering dose of Vyvanse  and assessing progress.  We discussed considerations, we would plan to lowered to 60 mg dose with next prescription which would be in a few weeks.  Advised for him to reach out around time of next refill to request lower dose to be sent in and we can do this Will plan for follow-up in about 2 to 3 months to monitor progress or sooner as needed  I discussed the above assessment and treatment plan with the patient. The patient was provided an opportunity to ask questions and all were answered. The patient agreed with the plan and demonstrated an understanding of the instructions.  The patient was advised to call back or seek an in-person evaluation if the symptoms worsen or if the condition fails to improve as anticipated.  I provided 12 minutes of face to face and non-face-to-face time during this encounter date, time was needed to gather information, review chart, records, communicate/coordinate with staff remotely, as well as complete documentation.   ___________________________________________ Natara Monfort de [place], MD, ABFM, CAQSM Primary Care and Sports Medicine [facility] "

## 2024-05-11 NOTE — Assessment & Plan Note (Signed)
 Patient doing well at present with use of Vyvanse  and guanfacine .  He does not need Vyvanse  refilled at this time.  Denies any issues with sleep concerns, palpitations, appetite concerns.  PDMP reviewed, no red flags. Previously, we have tapered dose of guanfacine  with plans to gradually discontinue.  He reports that he has been doing well with current dose, no concerns reported at this time.   We reviewed medication, he would like to consider lowering dose of Vyvanse  and assessing progress.  We discussed considerations, we would plan to lowered to 60 mg dose with next prescription which would be in a few weeks.  Advised for him to reach out around time of next refill to request lower dose to be sent in and we can do this Will plan for follow-up in about 2 to 3 months to monitor progress or sooner as needed

## 2024-08-10 ENCOUNTER — Ambulatory Visit (HOSPITAL_BASED_OUTPATIENT_CLINIC_OR_DEPARTMENT_OTHER): Admitting: Family Medicine
